# Patient Record
Sex: Female | Born: 1945 | Race: White | Hispanic: No | Marital: Married | Smoking: Former smoker
Health system: Southern US, Community
[De-identification: ages and names within clinical notes are randomized; demographics above are authoritative.]

## PROBLEM LIST (undated history)

## (undated) DIAGNOSIS — F329 Major depressive disorder, single episode, unspecified: Secondary | ICD-10-CM

## (undated) DIAGNOSIS — C52 Malignant neoplasm of vagina: Secondary | ICD-10-CM

## (undated) DIAGNOSIS — F32A Depression, unspecified: Secondary | ICD-10-CM

## (undated) DIAGNOSIS — I4891 Unspecified atrial fibrillation: Secondary | ICD-10-CM

## (undated) DIAGNOSIS — M810 Age-related osteoporosis without current pathological fracture: Secondary | ICD-10-CM

## (undated) DIAGNOSIS — C50919 Malignant neoplasm of unspecified site of unspecified female breast: Secondary | ICD-10-CM

## (undated) DIAGNOSIS — I10 Essential (primary) hypertension: Secondary | ICD-10-CM

## (undated) DIAGNOSIS — J309 Allergic rhinitis, unspecified: Secondary | ICD-10-CM

## (undated) HISTORY — DX: Essential (primary) hypertension: I10

## (undated) HISTORY — PX: NASAL SEPTUM SURGERY: SHX37

## (undated) HISTORY — DX: Malignant neoplasm of unspecified site of unspecified female breast: C50.919

## (undated) HISTORY — DX: Malignant neoplasm of vagina: C52

## (undated) HISTORY — DX: Unspecified atrial fibrillation: I48.91

## (undated) HISTORY — DX: Major depressive disorder, single episode, unspecified: F32.9

## (undated) HISTORY — DX: Depression, unspecified: F32.A

## (undated) HISTORY — DX: Age-related osteoporosis without current pathological fracture: M81.0

## (undated) HISTORY — PX: TUBAL LIGATION: SHX77

## (undated) HISTORY — DX: Allergic rhinitis, unspecified: J30.9

---

## 2007-07-22 ENCOUNTER — Ambulatory Visit: Payer: Self-pay | Admitting: Gastroenterology

## 2012-04-11 DIAGNOSIS — IMO0002 Reserved for concepts with insufficient information to code with codable children: Secondary | ICD-10-CM | POA: Insufficient documentation

## 2013-02-18 DIAGNOSIS — L739 Follicular disorder, unspecified: Secondary | ICD-10-CM | POA: Insufficient documentation

## 2013-02-18 DIAGNOSIS — D1801 Hemangioma of skin and subcutaneous tissue: Secondary | ICD-10-CM | POA: Insufficient documentation

## 2013-04-13 DIAGNOSIS — Z853 Personal history of malignant neoplasm of breast: Secondary | ICD-10-CM | POA: Insufficient documentation

## 2015-01-25 DIAGNOSIS — F341 Dysthymic disorder: Secondary | ICD-10-CM | POA: Diagnosis not present

## 2015-01-25 DIAGNOSIS — I10 Essential (primary) hypertension: Secondary | ICD-10-CM | POA: Diagnosis not present

## 2015-01-25 DIAGNOSIS — E341 Other hypersecretion of intestinal hormones: Secondary | ICD-10-CM | POA: Diagnosis not present

## 2015-01-25 DIAGNOSIS — Z803 Family history of malignant neoplasm of breast: Secondary | ICD-10-CM | POA: Diagnosis not present

## 2015-03-18 HISTORY — PX: BREAST SURGERY: SHX581

## 2015-03-22 DIAGNOSIS — N651 Disproportion of reconstructed breast: Secondary | ICD-10-CM | POA: Diagnosis not present

## 2015-03-22 DIAGNOSIS — M8580 Other specified disorders of bone density and structure, unspecified site: Secondary | ICD-10-CM | POA: Diagnosis not present

## 2015-03-22 DIAGNOSIS — K219 Gastro-esophageal reflux disease without esophagitis: Secondary | ICD-10-CM | POA: Diagnosis not present

## 2015-03-22 DIAGNOSIS — Z421 Encounter for breast reconstruction following mastectomy: Secondary | ICD-10-CM | POA: Diagnosis not present

## 2015-03-22 DIAGNOSIS — I10 Essential (primary) hypertension: Secondary | ICD-10-CM | POA: Diagnosis not present

## 2015-03-22 DIAGNOSIS — Z853 Personal history of malignant neoplasm of breast: Secondary | ICD-10-CM | POA: Diagnosis not present

## 2015-03-22 DIAGNOSIS — Z87891 Personal history of nicotine dependence: Secondary | ICD-10-CM | POA: Diagnosis not present

## 2015-03-22 DIAGNOSIS — F329 Major depressive disorder, single episode, unspecified: Secondary | ICD-10-CM | POA: Diagnosis not present

## 2015-03-22 DIAGNOSIS — Z9011 Acquired absence of right breast and nipple: Secondary | ICD-10-CM | POA: Diagnosis not present

## 2015-07-14 DIAGNOSIS — Z1239 Encounter for other screening for malignant neoplasm of breast: Secondary | ICD-10-CM

## 2015-07-14 DIAGNOSIS — M810 Age-related osteoporosis without current pathological fracture: Secondary | ICD-10-CM

## 2015-07-14 DIAGNOSIS — J309 Allergic rhinitis, unspecified: Secondary | ICD-10-CM

## 2015-07-14 DIAGNOSIS — C52 Malignant neoplasm of vagina: Secondary | ICD-10-CM

## 2015-07-14 DIAGNOSIS — I10 Essential (primary) hypertension: Secondary | ICD-10-CM

## 2015-07-14 DIAGNOSIS — C50919 Malignant neoplasm of unspecified site of unspecified female breast: Secondary | ICD-10-CM | POA: Insufficient documentation

## 2015-07-14 DIAGNOSIS — F32A Depression, unspecified: Secondary | ICD-10-CM | POA: Insufficient documentation

## 2015-07-14 DIAGNOSIS — M8588 Other specified disorders of bone density and structure, other site: Secondary | ICD-10-CM | POA: Insufficient documentation

## 2015-07-14 DIAGNOSIS — Z8544 Personal history of malignant neoplasm of other female genital organs: Secondary | ICD-10-CM | POA: Insufficient documentation

## 2015-07-14 DIAGNOSIS — F329 Major depressive disorder, single episode, unspecified: Secondary | ICD-10-CM | POA: Insufficient documentation

## 2015-07-18 ENCOUNTER — Ambulatory Visit (INDEPENDENT_AMBULATORY_CARE_PROVIDER_SITE_OTHER): Payer: Medicare Other | Admitting: Unknown Physician Specialty

## 2015-07-18 ENCOUNTER — Encounter: Payer: Self-pay | Admitting: Unknown Physician Specialty

## 2015-07-18 DIAGNOSIS — R079 Chest pain, unspecified: Secondary | ICD-10-CM

## 2015-07-18 DIAGNOSIS — I10 Essential (primary) hypertension: Secondary | ICD-10-CM | POA: Diagnosis not present

## 2015-07-18 DIAGNOSIS — F32A Depression, unspecified: Secondary | ICD-10-CM

## 2015-07-18 DIAGNOSIS — F329 Major depressive disorder, single episode, unspecified: Secondary | ICD-10-CM | POA: Diagnosis not present

## 2015-07-18 MED ORDER — CITALOPRAM HYDROBROMIDE 40 MG PO TABS: 40.0000 mg | ORAL_TABLET | Freq: Every day | ORAL | Status: DC

## 2015-07-18 MED ORDER — LOSARTAN POTASSIUM-HCTZ 50-12.5 MG PO TABS: 1.0000 | ORAL_TABLET | Freq: Every day | ORAL | Status: DC

## 2015-07-18 NOTE — Progress Notes (Signed)
BP 118/76 mmHg  Pulse 73  Temp(Src) 98.2 F (36.8 C)  Ht 5' 6.7" (1.694 m)  Wt 139 lb (63.05 kg)  BMI 21.97 kg/m2  SpO2 97%  LMP 01/03/1999 (Approximate)   Subjective:    Patient ID: Melanie Wallace, female    DOB: [birth year], 69 y.o.   MRN: [id number]  HPI: Melanie Wallace is a 69 y.o. female  Chief Complaint  Patient presents with  . Depression  . Hypertension   Depression Would like me to f/u with the Citalopram that she takes 40 mg QD through psychiatry.  Her depression is stable with a PHQ 9 of 0.  She would like to stay on it.    Hypertension This is a chronic (Here for refills) problem. The problem is controlled. Associated symptoms include chest pain. Pertinent negatives include no anxiety, palpitations, peripheral edema or shortness of breath. (One episode of chest pain off and on for 1 day about 6 weeks ago.  She has been fine since then.  Describes as a "pressure" pain) There are no associated agents to hypertension. The current treatment provides significant improvement. There are no compliance problems.      Relevant past medical, surgical, family and social history reviewed and updated as indicated. Interim medical history since our last visit reviewed. Allergies and medications reviewed and updated.  Review of Systems  Respiratory: Negative for shortness of breath.   Cardiovascular: Positive for chest pain. Negative for palpitations.    Per HPI unless specifically indicated above     Objective:    BP 118/76 mmHg  Pulse 73  Temp(Src) 98.2 F (36.8 C)  Ht 5' 6.7" (1.694 m)  Wt 139 lb (63.05 kg)  BMI 21.97 kg/m2  SpO2 97%  LMP 01/03/1999 (Approximate)  Wt Readings from Last 3 Encounters:  07/18/15 139 lb (63.05 kg)  01/25/15 143 lb (64.864 kg)    Physical Exam  Constitutional: She is oriented to person, place, and time. She appears well-developed and well-nourished. No distress.  HENT:  Head: Normocephalic and atraumatic.  Eyes: Conjunctivae  and lids are normal. Right eye exhibits no discharge. Left eye exhibits no discharge. No scleral icterus.  Cardiovascular: Normal rate and regular rhythm.   Pulmonary/Chest: Effort normal and breath sounds normal. No respiratory distress.  Abdominal: Soft. Normal appearance and bowel sounds are normal. She exhibits no distension. There is no splenomegaly or hepatomegaly. There is no tenderness.  Musculoskeletal: Normal range of motion.  Neurological: She is alert and oriented to person, place, and time.  Skin: Skin is intact. No rash noted. No pallor.  Psychiatric: She has a normal mood and affect. Her behavior is normal. Judgment and thought content normal.    No results found for this or any previous visit.    Assessment & Plan:   Problem List Items Addressed This Visit      Unprioritized   Hypertension   Relevant Medications   losartan-hydrochlorothiazide (HYZAAR) 50-12.5 MG per tablet   Other Relevant Orders   EKG 12-Lead (Completed)   Depression    Stable on 40 mg Citalopram.  Continue current medications.        Relevant Medications   citalopram (CELEXA) 40 MG tablet    Other Visit Diagnoses    Chest pain, unspecified chest pain type        EKG was normal.  No episodes since once 6 weeks ago.  F/U prn    Relevant Orders    EKG 12-Lead (Completed)  EKG was normal   Follow up plan: Return in about 6 months (around 01/18/2016) for physical.

## 2015-07-18 NOTE — Assessment & Plan Note (Signed)
Stable on 40 mg Citalopram.  Continue current medications.

## 2015-09-06 DIAGNOSIS — N651 Disproportion of reconstructed breast: Secondary | ICD-10-CM | POA: Diagnosis not present

## 2015-09-06 DIAGNOSIS — K219 Gastro-esophageal reflux disease without esophagitis: Secondary | ICD-10-CM | POA: Diagnosis not present

## 2015-09-06 DIAGNOSIS — Z853 Personal history of malignant neoplasm of breast: Secondary | ICD-10-CM | POA: Diagnosis not present

## 2015-09-06 DIAGNOSIS — Z9011 Acquired absence of right breast and nipple: Secondary | ICD-10-CM | POA: Diagnosis not present

## 2015-09-06 DIAGNOSIS — N650 Deformity of reconstructed breast: Secondary | ICD-10-CM | POA: Diagnosis not present

## 2015-09-06 DIAGNOSIS — I10 Essential (primary) hypertension: Secondary | ICD-10-CM | POA: Diagnosis not present

## 2015-09-06 DIAGNOSIS — C50911 Malignant neoplasm of unspecified site of right female breast: Secondary | ICD-10-CM | POA: Diagnosis not present

## 2015-10-13 DIAGNOSIS — I10 Essential (primary) hypertension: Secondary | ICD-10-CM | POA: Diagnosis not present

## 2015-10-13 DIAGNOSIS — Z08 Encounter for follow-up examination after completed treatment for malignant neoplasm: Secondary | ICD-10-CM | POA: Diagnosis not present

## 2015-10-13 DIAGNOSIS — Z853 Personal history of malignant neoplasm of breast: Secondary | ICD-10-CM | POA: Diagnosis not present

## 2015-10-13 DIAGNOSIS — C50911 Malignant neoplasm of unspecified site of right female breast: Secondary | ICD-10-CM | POA: Diagnosis not present

## 2015-10-13 DIAGNOSIS — Z7982 Long term (current) use of aspirin: Secondary | ICD-10-CM | POA: Diagnosis not present

## 2015-10-13 DIAGNOSIS — Z79899 Other long term (current) drug therapy: Secondary | ICD-10-CM | POA: Diagnosis not present

## 2015-10-13 DIAGNOSIS — Z9882 Breast implant status: Secondary | ICD-10-CM | POA: Diagnosis not present

## 2015-10-13 DIAGNOSIS — F329 Major depressive disorder, single episode, unspecified: Secondary | ICD-10-CM | POA: Diagnosis not present

## 2015-10-13 DIAGNOSIS — Z9011 Acquired absence of right breast and nipple: Secondary | ICD-10-CM | POA: Diagnosis not present

## 2015-10-13 DIAGNOSIS — R921 Mammographic calcification found on diagnostic imaging of breast: Secondary | ICD-10-CM | POA: Diagnosis not present

## 2015-11-08 LAB — HM MAMMOGRAPHY: HM Mammogram: NORMAL (ref 0–4)

## 2016-01-03 DIAGNOSIS — Z9011 Acquired absence of right breast and nipple: Secondary | ICD-10-CM | POA: Diagnosis not present

## 2016-01-27 ENCOUNTER — Encounter: Payer: Medicare Other | Admitting: Unknown Physician Specialty

## 2016-01-31 ENCOUNTER — Ambulatory Visit (INDEPENDENT_AMBULATORY_CARE_PROVIDER_SITE_OTHER): Payer: Medicare Other | Admitting: Unknown Physician Specialty

## 2016-01-31 ENCOUNTER — Encounter: Payer: Self-pay | Admitting: Unknown Physician Specialty

## 2016-01-31 VITALS — BP 139/92 | HR 40 | Temp 97.7°F | Ht 66.2 in | Wt 148.4 lb

## 2016-01-31 DIAGNOSIS — I10 Essential (primary) hypertension: Secondary | ICD-10-CM

## 2016-01-31 DIAGNOSIS — F329 Major depressive disorder, single episode, unspecified: Secondary | ICD-10-CM

## 2016-01-31 DIAGNOSIS — Z0000 Encounter for general adult medical examination without abnormal findings: Secondary | ICD-10-CM | POA: Diagnosis not present

## 2016-01-31 DIAGNOSIS — F32A Depression, unspecified: Secondary | ICD-10-CM

## 2016-01-31 LAB — MICROALBUMIN, URINE WAIVED
Creatinine, Urine Waived: 50 mg/dL (ref 10–300)
Microalb, Ur Waived: 10 mg/L (ref 0–19)

## 2016-01-31 MED ORDER — LOSARTAN POTASSIUM-HCTZ 50-12.5 MG PO TABS: 1.0000 | ORAL_TABLET | Freq: Every day | ORAL | Status: DC

## 2016-01-31 MED ORDER — CITALOPRAM HYDROBROMIDE 40 MG PO TABS: 40.0000 mg | ORAL_TABLET | Freq: Every day | ORAL | Status: DC

## 2016-01-31 NOTE — Progress Notes (Signed)
BP 139/92 mmHg  Pulse 40  Temp(Src) 97.7 F (36.5 C)  Ht 5' 6.2" (1.681 m)  Wt 148 lb 6.4 oz (67.314 kg)  BMI 23.82 kg/m2  SpO2 99%  LMP 01/03/1999 (Approximate)   Subjective:    Patient ID: Melanie Wallace, female    DOB: 15-Nov-1946, 70 y.o.   MRN: [id number]  HPI: Melanie Wallace is a 70 y.o. female  Chief Complaint  Patient presents with  . Medicare Wellness   Functional Status Survey: Is the patient deaf or have difficulty hearing?: No Does the patient have difficulty seeing, even when wearing glasses/contacts?: No Does the patient have difficulty concentrating, remembering, or making decisions?: No Does the patient have difficulty walking or climbing stairs?: No Does the patient have difficulty dressing or bathing?: No Does the patient have difficulty doing errands alone such as visiting a doctor's office or shopping?: No  Depression screen [facility] 2/9 01/31/2016 07/18/2015  Decreased Interest 0 0  Down, Depressed, Hopeless 0 0  PHQ - 2 Score 0 0   Fall Risk  01/31/2016 07/18/2015  Falls in the past year? No No    Had guiac stool cards done.  Interested in cologuard.    Breast checks done by breast surgeon  Hypertension Using medications without difficulty Average home BPs: 120's/70s   No problems or lightheadedness No chest pain with exertion or shortness of breath No Edema Exercise: none  Relevant past medical, surgical, family and social history reviewed and updated as indicated. Interim medical history since our last visit reviewed. Allergies and medications reviewed and updated.  Review of Systems  Constitutional: Negative.   HENT: Negative.   Eyes: Negative.   Respiratory: Negative.   Cardiovascular: Negative.   Gastrointestinal: Negative.   Endocrine: Negative.   Genitourinary: Negative.   Musculoskeletal: Negative.   Skin: Negative.   Allergic/Immunologic: Negative.   Neurological: Negative.   Hematological: Negative.   Psychiatric/Behavioral:  Negative.     Per HPI unless specifically indicated above     Objective:    BP 139/92 mmHg  Pulse 40  Temp(Src) 97.7 F (36.5 C)  Ht 5' 6.2" (1.681 m)  Wt 148 lb 6.4 oz (67.314 kg)  BMI 23.82 kg/m2  SpO2 99%  LMP 01/03/1999 (Approximate)  Wt Readings from Last 3 Encounters:  01/31/16 148 lb 6.4 oz (67.314 kg)  07/18/15 139 lb (63.05 kg)  01/25/15 143 lb (64.864 kg)    Physical Exam  Constitutional: She is oriented to person, place, and time. She appears well-developed and well-nourished.  HENT:  Head: Normocephalic and atraumatic.  Eyes: Pupils are equal, round, and reactive to light. Right eye exhibits no discharge. Left eye exhibits no discharge. No scleral icterus.  Neck: Normal range of motion. Neck supple. Carotid bruit is not present. No thyromegaly present.  Cardiovascular: Normal rate, regular rhythm and normal heart sounds.  Exam reveals no gallop and no friction rub.   No murmur heard. Pulmonary/Chest: Effort normal and breath sounds normal. No respiratory distress. She has no wheezes. She has no rales.  Abdominal: Soft. Bowel sounds are normal. There is no tenderness. There is no rebound.  Musculoskeletal: Normal range of motion.  Lymphadenopathy:    She has no cervical adenopathy.  Neurological: She is alert and oriented to person, place, and time.  Skin: Skin is warm, dry and intact. No rash noted.  Psychiatric: She has a normal mood and affect. Her speech is normal and behavior is normal. Judgment and thought content normal. Cognition and  memory are normal.    No results found for this or any previous visit.    Assessment & Plan:   Problem List Items Addressed This Visit      Unprioritized   Hypertension - Primary   Relevant Medications   losartan-hydrochlorothiazide (HYZAAR) 50-12.5 MG tablet   Other Relevant Orders   Microalbumin, Urine Waived   Comprehensive metabolic panel   Lipid Panel w/o Chol/HDL Ratio   Depression   Relevant Medications    citalopram (CELEXA) 40 MG tablet    Other Visit Diagnoses    Annual physical exam        Relevant Orders    Hepatitis C antibody      Diagnosis stable.  Continue present medications.     Follow up plan: Return in about 6 months (around 07/30/2016).

## 2016-02-01 ENCOUNTER — Encounter: Payer: Self-pay | Admitting: Unknown Physician Specialty

## 2016-02-01 LAB — COMPREHENSIVE METABOLIC PANEL
ALBUMIN: 4.6 g/dL (ref 3.6–4.8)
ALT: 10 IU/L (ref 0–32)
AST: 17 IU/L (ref 0–40)
Albumin/Globulin Ratio: 2.2 (ref 1.1–2.5)
Alkaline Phosphatase: 51 IU/L (ref 39–117)
BILIRUBIN TOTAL: 0.6 mg/dL (ref 0.0–1.2)
BUN / CREAT RATIO: 21 (ref 11–26)
BUN: 14 mg/dL (ref 8–27)
CO2: 26 mmol/L (ref 18–29)
CREATININE: 0.66 mg/dL (ref 0.57–1.00)
Calcium: 9.6 mg/dL (ref 8.7–10.3)
Chloride: 99 mmol/L (ref 96–106)
GFR calc non Af Amer: 91 mL/min/{1.73_m2} (ref >59)
GFR, EST AFRICAN AMERICAN: 105 mL/min/{1.73_m2} (ref >59)
GLOBULIN, TOTAL: 2.1 g/dL (ref 1.5–4.5)
GLUCOSE: 94 mg/dL (ref 65–99)
Potassium: 4.2 mmol/L (ref 3.5–5.2)
SODIUM: 138 mmol/L (ref 134–144)
TOTAL PROTEIN: 6.7 g/dL (ref 6.0–8.5)

## 2016-02-01 LAB — LIPID PANEL W/O CHOL/HDL RATIO
Cholesterol, Total: 196 mg/dL (ref 100–199)
HDL: 58 mg/dL (ref >39)
LDL CALC: 109 mg/dL — AB (ref 0–99)
Triglycerides: 143 mg/dL (ref 0–149)
VLDL CHOLESTEROL CAL: 29 mg/dL (ref 5–40)

## 2016-02-01 LAB — HEPATITIS C ANTIBODY: Hep C Virus Ab: <0.1 s/co ratio (ref 0.0–0.9)

## 2016-02-01 NOTE — Progress Notes (Signed)
Quick Note:  Normal labs. Patient notified by letter. ______ 

## 2016-07-31 ENCOUNTER — Ambulatory Visit (INDEPENDENT_AMBULATORY_CARE_PROVIDER_SITE_OTHER): Payer: Medicare Other | Admitting: Unknown Physician Specialty

## 2016-07-31 ENCOUNTER — Encounter: Payer: Self-pay | Admitting: Unknown Physician Specialty

## 2016-07-31 VITALS — BP 125/80 | HR 82 | Temp 97.9°F | Ht 67.2 in | Wt 146.4 lb

## 2016-07-31 DIAGNOSIS — I10 Essential (primary) hypertension: Secondary | ICD-10-CM | POA: Diagnosis not present

## 2016-07-31 DIAGNOSIS — Z23 Encounter for immunization: Secondary | ICD-10-CM | POA: Diagnosis not present

## 2016-07-31 DIAGNOSIS — F329 Major depressive disorder, single episode, unspecified: Secondary | ICD-10-CM

## 2016-07-31 DIAGNOSIS — F32A Depression, unspecified: Secondary | ICD-10-CM

## 2016-07-31 DIAGNOSIS — Z0000 Encounter for general adult medical examination without abnormal findings: Secondary | ICD-10-CM | POA: Diagnosis not present

## 2016-07-31 NOTE — Assessment & Plan Note (Signed)
Stable, continue present medications.   

## 2016-07-31 NOTE — Progress Notes (Signed)
BP 125/80 (BP Location: Left Arm, Patient Position: Sitting, Cuff Size: Normal)   Pulse 82   Temp 97.9 F (36.6 C)   Ht 5' 7.2" (1.707 m)   Wt 146 lb 6.4 oz (66.4 kg)   LMP 01/03/1999 (Approximate)   SpO2 98%   BMI 22.79 kg/m    Subjective:    Patient ID: Melanie Wallace, female    DOB: [birth year], 70 y.o.   MRN: [id number]  HPI: Melanie Wallace is a 70 y.o. female  Chief Complaint  Patient presents with  . Depression  . Hypertension  . Ankle Pain    pt states she has a dull ache in her right ankle when she walks sometimes 0   Depression "I feel good" Depression screen [facility] 2/9 07/31/2016 01/31/2016 07/18/2015  Decreased Interest 0 0 0  Down, Depressed, Hopeless 0 0 0  PHQ - 2 Score 0 0 0   Hypertension Using medications without difficulty Average home BPs Not checking  No problems or lightheadedness No chest pain with exertion or shortness of breath No Edema  Ankle pain Pt with medial ankle and lower leg  Relevant past medical, surgical, family and social history reviewed and updated as indicated. Interim medical history since our last visit reviewed. Allergies and medications reviewed and updated.  Review of Systems  Per HPI unless specifically indicated above     Objective:    BP 125/80 (BP Location: Left Arm, Patient Position: Sitting, Cuff Size: Normal)   Pulse 82   Temp 97.9 F (36.6 C)   Ht 5' 7.2" (1.707 m)   Wt 146 lb 6.4 oz (66.4 kg)   LMP 01/03/1999 (Approximate)   SpO2 98%   BMI 22.79 kg/m   Wt Readings from Last 3 Encounters:  07/31/16 146 lb 6.4 oz (66.4 kg)  01/31/16 148 lb 6.4 oz (67.3 kg)  07/18/15 139 lb (63 kg)    Physical Exam  Constitutional: She is oriented to person, place, and time. She appears well-developed and well-nourished. No distress.  HENT:  Head: Normocephalic and atraumatic.  Eyes: Conjunctivae and lids are normal. Right eye exhibits no discharge. Left eye exhibits no discharge. No scleral icterus.  Neck: Normal  range of motion. Neck supple. No JVD present. Carotid bruit is not present.  Cardiovascular: Normal rate, regular rhythm and normal heart sounds.   Pulmonary/Chest: Effort normal and breath sounds normal.  Abdominal: Normal appearance. There is no splenomegaly or hepatomegaly.  Musculoskeletal: Normal range of motion.  Neurological: She is alert and oriented to person, place, and time.  Skin: Skin is warm, dry and intact. No rash noted. No pallor.  Psychiatric: She has a normal mood and affect. Her behavior is normal. Judgment and thought content normal.    Results for orders placed or performed in visit on 07/31/16  HM MAMMOGRAPHY  Result Value Ref Range   HM Mammogram Self Reported Normal 0-4 Bi-Rad, Self Reported Normal      Assessment & Plan:   Problem List Items Addressed This Visit      Unprioritized   Depression    Stable, continue present medications.        Hypertension    Stable, continue present medications.         Other Visit Diagnoses    Need for diphtheria-tetanus-pertussis (Tdap) vaccine, adult/adolescent    -  Primary   Relevant Orders   Tdap vaccine greater than or equal to 7yo IM (Completed)   Routine general medical examination at a  health care facility       Relevant Orders   Ambulatory referral to Gastroenterology       Follow up plan: Return in about 6 months (around 01/31/2017) for physical.

## 2016-07-31 NOTE — Patient Instructions (Addendum)
Tdap Vaccine (Tetanus, Diphtheria and Pertussis): What You Need to Know 1. Why get vaccinated? Tetanus, diphtheria and pertussis are very serious diseases. Tdap vaccine can protect us from these diseases. And, Tdap vaccine given to pregnant women can protect newborn babies against pertussis. TETANUS (Lockjaw) is rare in the [place] today. It causes painful muscle tightening and stiffness, usually all over the body.  It can lead to tightening of muscles in the head and neck so you can't open your mouth, swallow, or sometimes even breathe. Tetanus kills about 1 out of 10 people who are infected even after receiving the best medical care. DIPHTHERIA is also rare in the [place] today. It can cause a thick coating to form in the back of the throat.  It can lead to breathing problems, heart failure, paralysis, and death. PERTUSSIS (Whooping Cough) causes severe coughing spells, which can cause difficulty breathing, vomiting and disturbed sleep.  It can also lead to weight loss, incontinence, and rib fractures. Up to 2 in 100 adolescents and 5 in 100 adults with pertussis are hospitalized or have complications, which could include pneumonia or death. These diseases are caused by bacteria. Diphtheria and pertussis are spread from person to person through secretions from coughing or sneezing. Tetanus enters the body through cuts, scratches, or wounds. Before vaccines, as many as 200,000 cases of diphtheria, 200,000 cases of pertussis, and hundreds of cases of tetanus, were reported in the [place] each year. Since vaccination began, reports of cases for tetanus and diphtheria have dropped by about 99% and for pertussis by about 80%. 2. Tdap vaccine Tdap vaccine can protect adolescents and adults from tetanus, diphtheria, and pertussis. One dose of Tdap is routinely given at age 11 or 12. People who did not get Tdap at that age should get it as soon as possible. Tdap is especially important  for healthcare professionals and anyone having close contact with a baby younger than 12 months. Pregnant women should get a dose of Tdap during every pregnancy, to protect the newborn from pertussis. Infants are most at risk for severe, life-threatening complications from pertussis. Another vaccine, called Td, protects against tetanus and diphtheria, but not pertussis. A Td booster should be given every 10 years. Tdap may be given as one of these boosters if you have never gotten Tdap before. Tdap may also be given after a severe cut or burn to prevent tetanus infection. Your doctor or the person giving you the vaccine can give you more information. Tdap may safely be given at the same time as other vaccines. 3. Some people should not get this vaccine  A person who has ever had a life-threatening allergic reaction after a previous dose of any diphtheria, tetanus or pertussis containing vaccine, OR has a severe allergy to any part of this vaccine, should not get Tdap vaccine. Tell the person giving the vaccine about any severe allergies.  Anyone who had coma or long repeated seizures within 7 days after a childhood dose of DTP or DTaP, or a previous dose of Tdap, should not get Tdap, unless a cause other than the vaccine was found. They can still get Td.  Talk to your doctor if you:  have seizures or another nervous system problem,  had severe pain or swelling after any vaccine containing diphtheria, tetanus or pertussis,  ever had a condition called Guillain-Barr Syndrome (GBS),  aren't feeling well on the day the shot is scheduled. 4. Risks With any medicine, including vaccines, there is   a chance of side effects. These are usually mild and go away on their own. Serious reactions are also possible but are rare. Most people who get Tdap vaccine do not have any problems with it. Mild problems following Tdap (Did not interfere with activities)  Pain where the shot was given (about 3 in 4  adolescents or 2 in 3 adults)  Redness or swelling where the shot was given (about 1 person in 5)  Mild fever of at least 100.4F (up to about 1 in 25 adolescents or 1 in 100 adults)  Headache (about 3 or 4 people in 10)  Tiredness (about 1 person in 3 or 4)  Nausea, vomiting, diarrhea, stomach ache (up to 1 in 4 adolescents or 1 in 10 adults)  Chills, sore joints (about 1 person in 10)  Body aches (about 1 person in 3 or 4)  Rash, swollen glands (uncommon) Moderate problems following Tdap (Interfered with activities, but did not require medical attention)  Pain where the shot was given (up to 1 in 5 or 6)  Redness or swelling where the shot was given (up to about 1 in 16 adolescents or 1 in 12 adults)  Fever over 102F (about 1 in 100 adolescents or 1 in 250 adults)  Headache (about 1 in 7 adolescents or 1 in 10 adults)  Nausea, vomiting, diarrhea, stomach ache (up to 1 or 3 people in 100)  Swelling of the entire arm where the shot was given (up to about 1 in 500). Severe problems following Tdap (Unable to perform usual activities; required medical attention)  Swelling, severe pain, bleeding and redness in the arm where the shot was given (rare). Problems that could happen after any vaccine:  People sometimes faint after a medical procedure, including vaccination. Sitting or lying down for about 15 minutes can help prevent fainting, and injuries caused by a fall. Tell your doctor if you feel dizzy, or have vision changes or ringing in the ears.  Some people get severe pain in the shoulder and have difficulty moving the arm where a shot was given. This happens very rarely.  Any medication can cause a severe allergic reaction. Such reactions from a vaccine are very rare, estimated at fewer than 1 in a million doses, and would happen within a few minutes to a few hours after the vaccination. As with any medicine, there is a very remote chance of a vaccine causing a serious  injury or death. The safety of vaccines is always being monitored. For more information, visit: www.cdc.gov/vaccinesafety/ 5. What if there is a serious problem? What should I look for?  Look for anything that concerns you, such as signs of a severe allergic reaction, very high fever, or unusual behavior.  Signs of a severe allergic reaction can include hives, swelling of the face and throat, difficulty breathing, a fast heartbeat, dizziness, and weakness. These would usually start a few minutes to a few hours after the vaccination. What should I do?  If you think it is a severe allergic reaction or other emergency that can't wait, call 9-1-1 or get the person to the nearest hospital. Otherwise, call your doctor.  Afterward, the reaction should be reported to the Vaccine Adverse Event Reporting System (VAERS). Your doctor might file this report, or you can do it yourself through the VAERS web site at www.vaers.hhs.gov, or by calling 1-800-822-7967. VAERS does not give medical advice.  6. The National Vaccine Injury Compensation Program The National Vaccine Injury Compensation Program (  VICP) is a federal program that was created to compensate people who may have been injured by certain vaccines. Persons who believe they may have been injured by a vaccine can learn about the program and about filing a claim by calling 1-800-338-2382 or visiting the VICP website at www.hrsa.gov/vaccinecompensation. There is a time limit to file a claim for compensation. 7. How can I learn more?  Ask your doctor. He or she can give you the vaccine package insert or suggest other sources of information.  Call your local or state health department.  Contact the Centers for Disease Control and Prevention (CDC):  Call 1-800-232-4636 (1-800-CDC-INFO) or  Visit CDC's website at www.cdc.gov/vaccines CDC Tdap Vaccine VIS (02/09/14)   This information is not intended to replace advice given to you by your health care  provider. Make sure you discuss any questions you have with your health care provider.   Document Released: 06/03/2012 Document Revised: 12/24/2014 Document Reviewed: 03/17/2014 Elsevier Interactive Patient Education 2016 [employer]  

## 2016-09-24 DIAGNOSIS — I480 Paroxysmal atrial fibrillation: Secondary | ICD-10-CM | POA: Diagnosis not present

## 2016-09-27 DIAGNOSIS — Z170 Estrogen receptor positive status [ER+]: Secondary | ICD-10-CM | POA: Diagnosis not present

## 2016-09-27 DIAGNOSIS — R142 Eructation: Secondary | ICD-10-CM | POA: Diagnosis not present

## 2016-09-27 DIAGNOSIS — C50911 Malignant neoplasm of unspecified site of right female breast: Secondary | ICD-10-CM | POA: Diagnosis not present

## 2016-09-27 DIAGNOSIS — R921 Mammographic calcification found on diagnostic imaging of breast: Secondary | ICD-10-CM | POA: Diagnosis not present

## 2016-09-27 DIAGNOSIS — N952 Postmenopausal atrophic vaginitis: Secondary | ICD-10-CM | POA: Diagnosis not present

## 2016-09-27 DIAGNOSIS — Z9229 Personal history of other drug therapy: Secondary | ICD-10-CM | POA: Diagnosis not present

## 2016-09-27 DIAGNOSIS — I480 Paroxysmal atrial fibrillation: Secondary | ICD-10-CM | POA: Diagnosis not present

## 2016-09-27 DIAGNOSIS — K219 Gastro-esophageal reflux disease without esophagitis: Secondary | ICD-10-CM | POA: Diagnosis not present

## 2016-09-27 DIAGNOSIS — D1801 Hemangioma of skin and subcutaneous tissue: Secondary | ICD-10-CM | POA: Diagnosis not present

## 2016-09-27 DIAGNOSIS — Z7901 Long term (current) use of anticoagulants: Secondary | ICD-10-CM | POA: Diagnosis not present

## 2016-09-27 DIAGNOSIS — I10 Essential (primary) hypertension: Secondary | ICD-10-CM | POA: Diagnosis not present

## 2016-09-27 DIAGNOSIS — Z853 Personal history of malignant neoplasm of breast: Secondary | ICD-10-CM | POA: Diagnosis not present

## 2016-09-27 DIAGNOSIS — Z87891 Personal history of nicotine dependence: Secondary | ICD-10-CM | POA: Diagnosis not present

## 2016-09-27 DIAGNOSIS — Z79899 Other long term (current) drug therapy: Secondary | ICD-10-CM | POA: Diagnosis not present

## 2016-09-27 DIAGNOSIS — Z08 Encounter for follow-up examination after completed treatment for malignant neoplasm: Secondary | ICD-10-CM | POA: Diagnosis not present

## 2016-09-27 DIAGNOSIS — Z7982 Long term (current) use of aspirin: Secondary | ICD-10-CM | POA: Diagnosis not present

## 2016-09-27 DIAGNOSIS — R922 Inconclusive mammogram: Secondary | ICD-10-CM | POA: Diagnosis not present

## 2016-09-27 DIAGNOSIS — Z9011 Acquired absence of right breast and nipple: Secondary | ICD-10-CM | POA: Diagnosis not present

## 2016-10-10 DIAGNOSIS — I361 Nonrheumatic tricuspid (valve) insufficiency: Secondary | ICD-10-CM | POA: Diagnosis not present

## 2016-10-10 DIAGNOSIS — I340 Nonrheumatic mitral (valve) insufficiency: Secondary | ICD-10-CM | POA: Diagnosis not present

## 2016-10-10 DIAGNOSIS — Z7901 Long term (current) use of anticoagulants: Secondary | ICD-10-CM | POA: Diagnosis not present

## 2016-10-10 DIAGNOSIS — Z23 Encounter for immunization: Secondary | ICD-10-CM | POA: Diagnosis not present

## 2016-10-10 DIAGNOSIS — K219 Gastro-esophageal reflux disease without esophagitis: Secondary | ICD-10-CM | POA: Diagnosis not present

## 2016-10-10 DIAGNOSIS — I480 Paroxysmal atrial fibrillation: Secondary | ICD-10-CM | POA: Diagnosis not present

## 2016-10-10 DIAGNOSIS — I10 Essential (primary) hypertension: Secondary | ICD-10-CM | POA: Diagnosis not present

## 2016-10-10 DIAGNOSIS — Z87891 Personal history of nicotine dependence: Secondary | ICD-10-CM | POA: Diagnosis not present

## 2016-10-10 DIAGNOSIS — I700 Atherosclerosis of aorta: Secondary | ICD-10-CM | POA: Diagnosis not present

## 2016-10-19 DIAGNOSIS — I2510 Atherosclerotic heart disease of native coronary artery without angina pectoris: Secondary | ICD-10-CM | POA: Diagnosis not present

## 2016-10-19 DIAGNOSIS — I10 Essential (primary) hypertension: Secondary | ICD-10-CM | POA: Diagnosis not present

## 2016-10-19 DIAGNOSIS — C50919 Malignant neoplasm of unspecified site of unspecified female breast: Secondary | ICD-10-CM | POA: Diagnosis not present

## 2016-10-19 DIAGNOSIS — I480 Paroxysmal atrial fibrillation: Secondary | ICD-10-CM | POA: Diagnosis not present

## 2017-01-14 DIAGNOSIS — I480 Paroxysmal atrial fibrillation: Secondary | ICD-10-CM | POA: Diagnosis not present

## 2017-01-14 DIAGNOSIS — I10 Essential (primary) hypertension: Secondary | ICD-10-CM | POA: Diagnosis not present

## 2017-02-04 ENCOUNTER — Encounter: Payer: Self-pay | Admitting: Unknown Physician Specialty

## 2017-02-04 ENCOUNTER — Ambulatory Visit (INDEPENDENT_AMBULATORY_CARE_PROVIDER_SITE_OTHER): Payer: Medicare Other | Admitting: Unknown Physician Specialty

## 2017-02-04 VITALS — BP 111/73 | HR 71 | Temp 97.8°F | Ht 67.0 in | Wt 154.1 lb

## 2017-02-04 DIAGNOSIS — F325 Major depressive disorder, single episode, in full remission: Secondary | ICD-10-CM | POA: Diagnosis not present

## 2017-02-04 DIAGNOSIS — I4891 Unspecified atrial fibrillation: Secondary | ICD-10-CM | POA: Diagnosis not present

## 2017-02-04 DIAGNOSIS — Z0000 Encounter for general adult medical examination without abnormal findings: Secondary | ICD-10-CM | POA: Diagnosis not present

## 2017-02-04 DIAGNOSIS — I10 Essential (primary) hypertension: Secondary | ICD-10-CM | POA: Diagnosis not present

## 2017-02-04 MED ORDER — CITALOPRAM HYDROBROMIDE 40 MG PO TABS: 40.0000 mg | ORAL_TABLET | Freq: Every day | ORAL | 3 refills | Status: DC

## 2017-02-04 MED ORDER — LOSARTAN POTASSIUM-HCTZ 50-12.5 MG PO TABS: 1.0000 | ORAL_TABLET | Freq: Every day | ORAL | 3 refills | Status: DC

## 2017-02-04 NOTE — Assessment & Plan Note (Signed)
Stable, continue present medications.   

## 2017-02-04 NOTE — Assessment & Plan Note (Addendum)
Continue Eliquis.  F/U with Dr. Sabra Heck

## 2017-02-04 NOTE — Progress Notes (Signed)
BP 111/73 (BP Location: Left Arm, Patient Position: Sitting, Cuff Size: Normal)   Pulse 71   Temp 97.8 F (36.6 C)   Ht 5\' 7"  (1.702 m) Comment: pt refused to remove shoes  Wt 154 lb 1.6 oz (69.9 kg) Comment: pt refused to remove shoes  LMP 01/03/1999 (Approximate)   SpO2 98%   BMI 24.14 kg/m    Subjective:    Patient ID: Melanie Wallace, female    DOB: 14-Aug-1946, 71 y.o.   MRN: [id number]  HPI: Melanie Wallace is a 71 y.o. female  Chief Complaint  Patient presents with  . Medicare Wellness   Social History   Social History  . Marital status: Married    Spouse name: N/A  . Number of children: N/A  . Years of education: N/A   Occupational History  . Not on file.   Social History Main Topics  . Smoking status: Former Research scientist (life sciences)  . Smokeless tobacco: Never Used  . Alcohol use No  . Drug use: No  . Sexual activity: Yes   Other Topics Concern  . Not on file   Social History Narrative  . No narrative on file   Family History  Problem Relation Age of Onset  . Cancer Mother     breast and bone  . Hypertension Mother   . Cancer Father     prostate  . Hypertension Sister   . Hypertension Daughter   . Hypothyroidism Daughter   . Allergies Daughter   . Hyperlipidemia Maternal Grandmother   . Heart attack Maternal Grandmother   . Stroke Paternal Grandfather    Past Medical History:  Diagnosis Date  . Allergic rhinitis   . Atrial fibrillation ([place])   . Breast CA ([place])   . Depression   . Hypertension   . Osteoporosis   . Vaginal cancer [facility])    Past Surgical History:  Procedure Laterality Date  . BREAST SURGERY  03/2015  . NASAL SEPTUM SURGERY    . TUBAL LIGATION     New onset A-fib Went for colonoscopy and found she was in A-fib.  She saw Dr. Sabra Heck that day and put on anti-coagulation, nuclear stress test, and labs.    Hypertension Using medications without difficulty   No problems or lightheadedness No chest pain with exertion or shortness of  breath No Edema  Functional Status Survey: Is the patient deaf or have difficulty hearing?: No Does the patient have difficulty seeing, even when wearing glasses/contacts?: No Does the patient have difficulty concentrating, remembering, or making decisions?: No Does the patient have difficulty walking or climbing stairs?: No Does the patient have difficulty dressing or bathing?: No Does the patient have difficulty doing errands alone such as visiting a doctor's office or shopping?: No  Fall Risk  02/04/2017 07/31/2016 01/31/2016 07/18/2015  Falls in the past year? No No No No   Depression screen [facility] 2/9 02/04/2017 07/31/2016 01/31/2016 07/18/2015  Decreased Interest 0 0 0 0  Down, Depressed, Hopeless 0 0 0 0  PHQ - 2 Score 0 0 0 0  Altered sleeping 0 - - -  Tired, decreased energy 0 - - -  Change in appetite 0 - - -  Feeling bad or failure about yourself  0 - - -  Trouble concentrating 0 - - -  Moving slowly or fidgety/restless 0 - - -  Suicidal thoughts 0 - - -  PHQ-9 Score 0 - - -    Relevant past medical, surgical, family  and social history reviewed and updated as indicated. Interim medical history since our last visit reviewed. Allergies and medications reviewed and updated.  Review of Systems  Per HPI unless specifically indicated above     Objective:    BP 111/73 (BP Location: Left Arm, Patient Position: Sitting, Cuff Size: Normal)   Pulse 71   Temp 97.8 F (36.6 C)   Ht 5\' 7"  (1.702 m) Comment: pt refused to remove shoes  Wt 154 lb 1.6 oz (69.9 kg) Comment: pt refused to remove shoes  LMP 01/03/1999 (Approximate)   SpO2 98%   BMI 24.14 kg/m   Wt Readings from Last 3 Encounters:  02/04/17 154 lb 1.6 oz (69.9 kg)  07/31/16 146 lb 6.4 oz (66.4 kg)  01/31/16 148 lb 6.4 oz (67.3 kg)    Physical Exam  Constitutional: She is oriented to person, place, and time. She appears well-developed and well-nourished.  HENT:  Head: Normocephalic and atraumatic.  Eyes: Pupils are  equal, round, and reactive to light. Right eye exhibits no discharge. Left eye exhibits no discharge. No scleral icterus.  Neck: Normal range of motion. Neck supple. Carotid bruit is not present. No thyromegaly present.  Cardiovascular: Normal rate, regular rhythm and normal heart sounds.  Exam reveals no gallop and no friction rub.   No murmur heard. Pulmonary/Chest: Effort normal and breath sounds normal. No respiratory distress. She has no wheezes. She has no rales.  Abdominal: Soft. Bowel sounds are normal. There is no tenderness. There is no rebound.  Genitourinary: No breast swelling, tenderness or discharge.  Musculoskeletal: Normal range of motion.  Lymphadenopathy:    She has no cervical adenopathy.  Neurological: She is alert and oriented to person, place, and time.  Skin: Skin is warm, dry and intact. No rash noted.  Psychiatric: She has a normal mood and affect. Her speech is normal and behavior is normal. Judgment and thought content normal. Cognition and memory are normal.   Labs done with cardiology  Results for orders placed or performed in visit on 07/31/16  HM MAMMOGRAPHY  Result Value Ref Range   HM Mammogram Self Reported Normal 0-4 Bi-Rad, Self Reported Normal      Assessment & Plan:   Problem List Items Addressed This Visit      Unprioritized   Atrial fibrillation ([place])    Continue Eliquis.  F/U with Dr. Sabra Heck      Relevant Medications   metoprolol succinate (TOPROL-XL) 25 MG 24 hr tablet   apixaban (ELIQUIS) 5 MG TABS tablet   Depression    Stable, continue present medications.        Hypertension    Stable, continue present medications.        Relevant Medications   metoprolol succinate (TOPROL-XL) 25 MG 24 hr tablet   apixaban (ELIQUIS) 5 MG TABS tablet   Other Relevant Orders   Lipid Panel w/o Chol/HDL Ratio    Other Visit Diagnoses    Annual physical exam    -  Primary       Follow up plan: Return in about 6 months (around  08/04/2017).

## 2017-02-05 ENCOUNTER — Encounter: Payer: Self-pay | Admitting: Unknown Physician Specialty

## 2017-02-05 LAB — LIPID PANEL W/O CHOL/HDL RATIO
CHOLESTEROL TOTAL: 180 mg/dL (ref 100–199)
HDL: 58 mg/dL (ref >39)
LDL Calculated: 95 mg/dL (ref 0–99)
TRIGLYCERIDES: 133 mg/dL (ref 0–149)
VLDL Cholesterol Cal: 27 mg/dL (ref 5–40)

## 2017-02-07 ENCOUNTER — Telehealth: Payer: Self-pay | Admitting: Unknown Physician Specialty

## 2017-02-07 ENCOUNTER — Emergency Department
Admission: EM | Admit: 2017-02-07 | Discharge: 2017-02-07 | Disposition: A | Discharge status: Home / Self Care | Payer: Medicare Other | Attending: Emergency Medicine | Admitting: Emergency Medicine

## 2017-02-07 ENCOUNTER — Emergency Department: Payer: Medicare Other

## 2017-02-07 DIAGNOSIS — W1839XA Other fall on same level, initial encounter: Secondary | ICD-10-CM | POA: Insufficient documentation

## 2017-02-07 DIAGNOSIS — S62101A Fracture of unspecified carpal bone, right wrist, initial encounter for closed fracture: Secondary | ICD-10-CM

## 2017-02-07 DIAGNOSIS — Y929 Unspecified place or not applicable: Secondary | ICD-10-CM | POA: Insufficient documentation

## 2017-02-07 DIAGNOSIS — Z87891 Personal history of nicotine dependence: Secondary | ICD-10-CM | POA: Insufficient documentation

## 2017-02-07 DIAGNOSIS — Z79899 Other long term (current) drug therapy: Secondary | ICD-10-CM | POA: Diagnosis not present

## 2017-02-07 DIAGNOSIS — Y9301 Activity, walking, marching and hiking: Secondary | ICD-10-CM | POA: Diagnosis not present

## 2017-02-07 DIAGNOSIS — S52571A Other intraarticular fracture of lower end of right radius, initial encounter for closed fracture: Secondary | ICD-10-CM | POA: Diagnosis not present

## 2017-02-07 DIAGNOSIS — Y999 Unspecified external cause status: Secondary | ICD-10-CM | POA: Insufficient documentation

## 2017-02-07 DIAGNOSIS — S6991XA Unspecified injury of right wrist, hand and finger(s), initial encounter: Secondary | ICD-10-CM | POA: Diagnosis present

## 2017-02-07 DIAGNOSIS — I10 Essential (primary) hypertension: Secondary | ICD-10-CM | POA: Insufficient documentation

## 2017-02-07 DIAGNOSIS — Z853 Personal history of malignant neoplasm of breast: Secondary | ICD-10-CM | POA: Diagnosis not present

## 2017-02-07 MED ORDER — OXYCODONE-ACETAMINOPHEN 5-325 MG PO TABS
ORAL_TABLET | ORAL | Status: AC
  Administered 2017-02-07: 1 via ORAL
  Filled 2017-02-07: qty 1

## 2017-02-07 MED ORDER — OXYCODONE-ACETAMINOPHEN 5-325 MG PO TABS: 1.0000 | ORAL_TABLET | ORAL | 0 refills | Status: DC | PRN

## 2017-02-07 MED ORDER — OXYCODONE-ACETAMINOPHEN 5-325 MG PO TABS
1.0000 | ORAL_TABLET | Freq: Once | ORAL | Status: AC
  Administered 2017-02-07: 1 via ORAL

## 2017-02-07 NOTE — ED Triage Notes (Signed)
Pt states her dog pulled her down chasing cats and injured her right wrist with noted swelling and eccymosis.Marland Kitchen

## 2017-02-07 NOTE — ED Provider Notes (Signed)
[facility] Emergency Department Provider Note   ____________________________________________   First MD Initiated Contact with Patient 02/07/17 [phone]     (approximate)  I have reviewed the triage vital signs and the nursing notes.   HISTORY  Chief Complaint Wrist Pain   HPI Melanie Wallace is a 71 y.o. female chief complaint of right wrist pain. Patient states that she fell while she was walking her dog. She states that he pulled on the leash causing her to fall. She denies any head injury or loss of consciousness. Patient states she has continued to have pain at her wrist but no other injuries.She denies any previous injury to her wrist. Currently she rates her pain as an 8/10. She has not taken any over-the-counter medication prior to arrival in the emergency room.   Past Medical History:  Diagnosis Date  . Allergic rhinitis   . Atrial fibrillation ([place])   . Breast CA ([place])   . Depression   . Hypertension   . Osteoporosis   . Vaginal cancer [facility])     Patient Active Problem List   Diagnosis Date Noted  . Atrial fibrillation ([place]) 02/04/2017  . Osteoporosis 07/14/2015  . Vaginal cancer ([place]) 07/14/2015  . Breast cancer ([place]) 07/14/2015  . Allergic rhinitis 07/14/2015  . Hypertension 07/14/2015  . Depression 07/14/2015    Past Surgical History:  Procedure Laterality Date  . BREAST SURGERY  03/2015  . NASAL SEPTUM SURGERY    . TUBAL LIGATION      Prior to Admission medications   Medication Sig Start Date End Date Taking? Authorizing Provider  apixaban (ELIQUIS) 5 MG TABS tablet Take 5 mg by mouth 2 (two) times daily. 09/24/16 09/24/17  Historical Provider, MD  calcium-vitamin D (OSCAL WITH D) 500-200 MG-UNIT per tablet Take 1 tablet by mouth.    Historical Provider, MD  citalopram (CELEXA) 40 MG tablet Take 1 tablet (40 mg total) by mouth at bedtime. 02/04/17   Kathrine Haddock, NP  losartan-hydrochlorothiazide (HYZAAR) 50-12.5 MG tablet Take  1 tablet by mouth daily. 02/04/17   Kathrine Haddock, NP  metoprolol succinate (TOPROL-XL) 25 MG 24 hr tablet Take 25 mg by mouth daily. 09/24/16 09/24/17  Historical Provider, MD  oxyCODONE-acetaminophen (PERCOCET) 5-325 MG tablet Take 1 tablet by mouth every 4 (four) hours as needed for severe pain. 02/07/17   Johnn Hai, PA-C    Allergies Sulfa antibiotics; Penicillin g benzathine; Green tea leaf ext; and Pen-vee k [penicillin v]  Family History  Problem Relation Age of Onset  . Cancer Mother     breast and bone  . Hypertension Mother   . Cancer Father     prostate  . Hypertension Sister   . Hypertension Daughter   . Hypothyroidism Daughter   . Allergies Daughter   . Hyperlipidemia Maternal Grandmother   . Heart attack Maternal Grandmother   . Stroke Paternal Grandfather     Social History Social History  Substance Use Topics  . Smoking status: Former Research scientist (life sciences)  . Smokeless tobacco: Never Used  . Alcohol use No    Review of Systems Constitutional: No fever/chills Eyes: No visual changes. ENT: No trauma Cardiovascular: Denies chest pain. Respiratory: Denies shortness of breath. Gastrointestinal: No abdominal pain.  No nausea, no vomiting.  Musculoskeletal: Pain right wrist. Skin: Negative for rash. Neurological: Negative for headaches, focal weakness or numbness.  10-point ROS otherwise negative.  ____________________________________________   PHYSICAL EXAM:  VITAL SIGNS: ED Triage Vitals  Enc Vitals Group  BP 02/07/17 0826 (!) 141/77     Pulse Rate 02/07/17 0826 82     Resp 02/07/17 0826 18     Temp 02/07/17 0826 97.9 F (36.6 C)     Temp Source 02/07/17 0826 Oral     SpO2 02/07/17 0826 99 %     Weight 02/07/17 0822 154 lb (69.9 kg)     Height 02/07/17 0822 5\' 7"  (1.702 m)     Head Circumference --      Peak Flow --      Pain Score 02/07/17 0822 8     Pain Loc --      Pain Edu? --      Excl. in [place]? --     Constitutional: Alert and oriented. Well  appearing and in no acute distress. Eyes: Conjunctivae are normal. PERRL. EOMI. Head: Atraumatic. Nose: No congestion/rhinnorhea. Neck: No stridor.   Cardiovascular: Normal rate, regular rhythm. Grossly normal heart sounds.  Good peripheral circulation. Respiratory: Normal respiratory effort.  No retractions. Lungs CTAB. Gastrointestinal: Soft and nontender. No distention.  Musculoskeletal: Examination of the right wrist there is no gross deformity however there is moderate soft tissue swelling present. Range of motion is restricted secondary to patient's pain. Capillary refill and motor sensory function are within normal limits. Neurologic:  Normal speech and language. No gross focal neurologic deficits are appreciated. No gait instability. Skin:  Skin is warm, dry and intact. No abrasions, ecchymosis or erythema was noted. There is soft tissue swelling at the distal portion of her radius and ulnar. Psychiatric: Mood and affect are normal. Speech and behavior are normal.  ____________________________________________   LABS (all labs ordered are listed, but only abnormal results are displayed)  Labs Reviewed - No data to display   RADIOLOGY  X-ray right wrist per radiologist shows mildly displaced fracture of the distal radius with intra-articular involvement. 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  I, Johnn Hai, personally viewed and evaluated these images (plain radiographs) as part of my medical decision making, as well as reviewing the written report by the radiologist. ____________________________________________   PROCEDURES  Procedure(s) performed: None  Procedures  Critical Care performed: No  ____________________________________________   INITIAL IMPRESSION / ASSESSMENT AND PLAN / ED COURSE  Pertinent labs & imaging results that were available during my care of the patient were reviewed by me and considered in my medical decision  making (see chart for details).  Patient was placed in an OCL wrist splint and sling. Patient was given prescription for Percocet as needed for pain. Patient is to call Dr. Rudene Christians office today to get an appointment for follow-up in the office. Patient was instructed not to take the splint off until seen by the orthopedist. Ice and elevation is needed today to decreased swelling and reduce pain. Patient was made aware that pain medication could cause drowsiness and increase her risk for falling.      ____________________________________________   FINAL CLINICAL IMPRESSION(S) / ED DIAGNOSES  Final diagnoses:  Other closed intra-articular fracture of distal end of right radius, initial encounter      NEW MEDICATIONS STARTED DURING THIS VISIT:  Discharge Medication List as of 02/07/2017  9:40 AM    START taking these medications   Details  oxyCODONE-acetaminophen (PERCOCET) 5-325 MG tablet Take 1 tablet by mouth every 4 (four) hours as needed for severe pain., Starting Thu 02/07/2017, Print         Note:  This document was prepared using Dragon voice  recognition software and may include unintentional dictation errors.    Johnn Hai, PA-C 02/07/17 [place] Quigley, MD 02/07/17 [phone]

## 2017-02-07 NOTE — Telephone Encounter (Signed)
Called and let patient know that [facility] sent her a letter with lab results stating that the results were normal. Will route to provider for referral to Dr. Rudene Christians. Patient stated she fractured her right wrist.

## 2017-02-07 NOTE — ED Notes (Signed)
Pt presents to ED with c/o R wrist pain. Pt states was walking her dog when he pulled on the leash causing her to fall. Pt presents with bruising and swelling to the R forearm and R wrist. Pt maintains movement, sensation, and pulses are intact. Cap fill < 3 seconds at this time. Pt denies LOC at this time.

## 2017-02-07 NOTE — Discharge Instructions (Signed)
Ice and elevate to decrease swelling and reduce pain. Call today for an appointment with Dr. Rudene Christians who is in the [facility] department. He is the orthopedist on call today. Percocet every 4 hours if needed for pain. Do not take extra Tylenol with this medication as it already has Tylenol in it. You may take ibuprofen if needed for extra patrol of pain. Do not take the splint off until seen by the orthopedist.

## 2017-02-08 DIAGNOSIS — S62109A Fracture of unspecified carpal bone, unspecified wrist, initial encounter for closed fracture: Secondary | ICD-10-CM | POA: Insufficient documentation

## 2017-02-08 NOTE — Telephone Encounter (Signed)
Called and let patient know that referral was entered as requested.

## 2017-02-12 ENCOUNTER — Ambulatory Visit: Payer: Self-pay | Admitting: Unknown Physician Specialty

## 2017-02-13 DIAGNOSIS — S6991XA Unspecified injury of right wrist, hand and finger(s), initial encounter: Secondary | ICD-10-CM | POA: Diagnosis not present

## 2017-02-13 DIAGNOSIS — S52571A Other intraarticular fracture of lower end of right radius, initial encounter for closed fracture: Secondary | ICD-10-CM | POA: Diagnosis not present

## 2017-02-19 DIAGNOSIS — S52571D Other intraarticular fracture of lower end of right radius, subsequent encounter for closed fracture with routine healing: Secondary | ICD-10-CM | POA: Diagnosis not present

## 2017-02-24 ENCOUNTER — Other Ambulatory Visit: Payer: Self-pay | Admitting: Unknown Physician Specialty

## 2017-03-08 DIAGNOSIS — S52571D Other intraarticular fracture of lower end of right radius, subsequent encounter for closed fracture with routine healing: Secondary | ICD-10-CM | POA: Diagnosis not present

## 2017-03-20 DIAGNOSIS — S52571D Other intraarticular fracture of lower end of right radius, subsequent encounter for closed fracture with routine healing: Secondary | ICD-10-CM | POA: Diagnosis not present

## 2017-04-11 DIAGNOSIS — S52571D Other intraarticular fracture of lower end of right radius, subsequent encounter for closed fracture with routine healing: Secondary | ICD-10-CM | POA: Diagnosis not present

## 2017-04-11 DIAGNOSIS — M65321 Trigger finger, right index finger: Secondary | ICD-10-CM | POA: Diagnosis not present

## 2017-04-18 ENCOUNTER — Ambulatory Visit: Payer: Medicare Other | Attending: Orthopedic Surgery | Admitting: Occupational Therapy

## 2017-04-18 DIAGNOSIS — M6281 Muscle weakness (generalized): Secondary | ICD-10-CM

## 2017-04-18 DIAGNOSIS — M25531 Pain in right wrist: Secondary | ICD-10-CM

## 2017-04-18 DIAGNOSIS — M79641 Pain in right hand: Secondary | ICD-10-CM | POA: Insufficient documentation

## 2017-04-18 DIAGNOSIS — M25631 Stiffness of right wrist, not elsewhere classified: Secondary | ICD-10-CM | POA: Diagnosis not present

## 2017-04-18 NOTE — Patient Instructions (Signed)
Heat Opposition AROM to all digits Prayer stretch and wrist flexion PROM  10 reps   2 -3 x day   PROM for wrist extention /flexion/RD and UD over edge of table  10 reps  AROM for wrist in all planes including supination  10 reps  3 x day  Stop when feeling slight pull and pain less than 1-2/10 - only slight pull or stretch   Wrist and thumb neoprene splint to wear during act to decrease pain

## 2017-04-18 NOTE — Therapy (Signed)
[place] [facility] [address] [place], [address] Phone: [phone]   Fax:  [phone]  Occupational Therapy Evaluation  Patient Details  Name: Melanie Wallace MRN: [id number] Date of Birth: May 11, 1946 Referring Provider: Arvella Nigh  Encounter Date: 04/18/2017      OT End of Session - 04/18/17 1837    Visit Number 1   Number of Visits 8   Date for OT Re-Evaluation 05/16/17   OT Start Time 0933   OT Stop Time 1039   OT Time Calculation (min) 66 min   Activity Tolerance Patient tolerated treatment well   Behavior During Therapy [facility] for tasks assessed/performed      Past Medical History:  Diagnosis Date  . Allergic rhinitis   . Atrial fibrillation ([place])   . Breast CA ([place])   . Depression   . Hypertension   . Osteoporosis   . Vaginal cancer [facility])     Past Surgical History:  Procedure Laterality Date  . BREAST SURGERY  03/2015  . NASAL SEPTUM SURGERY    . TUBAL LIGATION      There were no vitals filed for this visit.      Subjective Assessment - 04/18/17 0953    Subjective  Fell while walking dog 2/21 and ER 2/22 - was in cast for about 4 wks and then wrist splint - and then last week refer to OT for ROM for wrist and pain -    Patient Stated Goals Want to get the use of my R hand , no pain and good strength - open jars, lifting pots and pans, walk dog, remote control, carry gallon , bathing    Currently in Pain? Yes   Pain Score 2    Pain Location Wrist   Pain Orientation Right   Pain Descriptors / Indicators Aching   Pain Type Acute pain           [facility] OT Assessment - 04/18/17 0001      Assessment   Diagnosis R wrist distal radius fx   Referring Provider gaines   Onset Date 03/07/17     Precautions   Required Braces or Orthoses --  wrap for wrist     Home  Environment   Lives With Spouse     Prior Function   Vocation Retired   Leisure R hand dominant - rescue dog, walk, thrift  shop, house work ,  read ,play games on laptop     AROM   Right Forearm Pronation 90 Degrees   Right Forearm Supination 60 Degrees   Right Wrist Extension 38 Degrees   Right Wrist Flexion 45 Degrees   Right Wrist Radial Deviation 18 Degrees   Right Wrist Ulnar Deviation 28 Degrees   Left Wrist Extension 55 Degrees   Left Wrist Flexion 90 Degrees   Left Wrist Radial Deviation 20 Degrees   Left Wrist Ulnar Deviation 35 Degrees     Strength   Right Hand Grip (lbs) 20   Right Hand Lateral Pinch 6 lbs   Right Hand 3 Point Pinch 6 lbs   Left Hand Grip (lbs) 40-   Left Hand Lateral Pinch 14 lbs   Left Hand 3 Point Pinch 10 lbs       Fluido therapy done prior to review of HEP -AROM for wrist and digits in fluido to increase ROM and decrease stiffness and pain   Review HEP and handout provided   to do Heat Opposition AROM  to all digits Prayer stretch and wrist flexion PROM  10 reps   2 -3 x day   PROM for wrist extention /flexion/RD and UD over edge of table  10 reps  AROM for wrist in all planes including supination  10 reps  3 x day  Stop when feeling slight pull and pain less than 1-2/10 - only slight pull or stretch   Wrist and thumb neoprene splint to wear during act to decrease pain                   OT Education - 04/18/17 1836    Education provided Yes   Education Details HEP and findings of eval   Person(s) Educated Patient   Methods Demonstration;Explanation;Tactile cues;Verbal cues   Comprehension Verbal cues required;Returned demonstration;Verbalized understanding          OT Short Term Goals - 04/18/17 1848      OT SHORT TERM GOAL #1   Title Pain improve to less than 3/10 at the worse at the end of day    Baseline pain at the worse 9/10 - after using it during day - shifting gear 4-5/10   Time 3   Period Weeks   Status New     OT SHORT TERM GOAL #2   Title R wrist AROM improve with  at least 10 degrees in flexion, ext, supination to do  laundry , clean, cook    Baseline see flowsheet for ROM    Time 3   Period Weeks   Status New           OT Long Term Goals - 04/18/17 1853      OT LONG TERM GOAL #1   Title R hand Gripping and opposition improve WNL and no pain to use remote , lift pots, fasten bra   Baseline pain in palm , over thumb CMC and volar wrist during gripping and opposition - increase to 5/10- did had pain over A1pulley of 2nd digit in [facility] office   Time 4   Period Weeks   Status New     OT LONG TERM GOAL #2   Title R grip strength improve with more than 7 lbs to lift pots, change gears, carry groceries, feed dog    Baseline R grip 20 , L 40    Time 4   Period Weeks   Status New     OT LONG TERM GOAL #3   Title R prehension strength increase with 2-3 lbs to use remote, open package without increase symptoms    Baseline See flowsheet   Time 4   Period Weeks   Status New     OT LONG TERM GOAL #4   Title Function improve on PRWHE with score less than 10/50   Baseline NT - pain increase to 9/50 - decrease use because of stiffness and pain - will do PRWHE next session    Time 4   Period Weeks   Status New               Plan - 04/18/17 1839    Clinical Impression Statement Pt is about 9 wks out from R distal radius fracture - pt present with decrease wrist  AROM in all planes, with increase pain - pt also have increase pain with fisting and opposition in palm and over volar wrist -  pt do have AROM in digits WNL except 2nd MC flexion- pt did have pain over A1pulley at 2nd digit  last week at [facility] but did not had  in session - pt show decrease grip and prehension - all limiting her fu   Rehab Potential Good   OT Frequency 2x / week   OT Duration 4 weeks   OT Treatment/Interventions Self-care/ADL training;Fluidtherapy;Splinting;Patient/family education;Therapeutic exercises;Ultrasound;Passive range of motion;Parrafin;Iontophoresis;Manual Therapy   Plan assess progress with splint use , HEP    OT  Home Exercise Plan See pt instruction   Consulted and Agree with Plan of Care Patient      Patient will benefit from skilled therapeutic intervention in order to improve the following deficits and impairments:  Decreased range of motion, Impaired flexibility, Increased edema, Decreased knowledge of use of DME, Decreased strength, Impaired UE functional use, Pain  Visit Diagnosis: Pain in right hand - Plan: Ot plan of care cert/re-cert  Pain in right wrist - Plan: Ot plan of care cert/re-cert  Stiffness of right wrist, not elsewhere classified - Plan: Ot plan of care cert/re-cert  Muscle weakness (generalized) - Plan: Ot plan of care cert/re-cert    Problem List Patient Active Problem List   Diagnosis Date Noted  . Wrist fracture 02/08/2017  . Atrial fibrillation ([place]) 02/04/2017  . Osteoporosis 07/14/2015  . Vaginal cancer ([place]) 07/14/2015  . Breast cancer ([place]) 07/14/2015  . Allergic rhinitis 07/14/2015  . Hypertension 07/14/2015  . Depression 07/14/2015    Rosalyn Gess OTR/L,CLT 04/18/2017, 7:06 PM  [place] [facility] [address] [place], [address] Phone: [phone]   Fax:  [phone]  Name: Melanie Wallace MRN: [id number] Date of Birth: 24-Mar-1946

## 2017-04-23 ENCOUNTER — Ambulatory Visit: Payer: Medicare Other | Admitting: Occupational Therapy

## 2017-04-23 DIAGNOSIS — M6281 Muscle weakness (generalized): Secondary | ICD-10-CM | POA: Diagnosis not present

## 2017-04-23 DIAGNOSIS — M79641 Pain in right hand: Secondary | ICD-10-CM | POA: Diagnosis not present

## 2017-04-23 DIAGNOSIS — M25531 Pain in right wrist: Secondary | ICD-10-CM | POA: Diagnosis not present

## 2017-04-23 DIAGNOSIS — M25631 Stiffness of right wrist, not elsewhere classified: Secondary | ICD-10-CM | POA: Diagnosis not present

## 2017-04-23 NOTE — Therapy (Signed)
[facility] [address] [place], [address] Phone: [phone]   Fax:  [phone]  Occupational Therapy Treatment  Patient Details  Name: Melanie Wallace MRN: [id number] Date of Birth: [birth year] Referring Provider: Arvella Nigh  Encounter Date: 04/23/2017      OT End of Session - 04/23/17 0956    Visit Number 2   Number of Visits 8   Date for OT Re-Evaluation 05/16/17   OT Start Time 0945   OT Stop Time 1029   OT Time Calculation (min) 44 min   Activity Tolerance Patient tolerated treatment well   Behavior During Therapy [facility] for tasks assessed/performed      Past Medical History:  Diagnosis Date  . Allergic rhinitis   . Atrial fibrillation ([place])   . Breast CA ([place])   . Depression   . Hypertension   . Osteoporosis   . 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 BREAST SURGERY  03/2015  . NASAL SEPTUM SURGERY    . TUBAL LIGATION      There were no vitals filed for this visit.      Subjective Assessment - 04/23/17 0947    Subjective  Feel like llittle  more movement  and still pain at the wrist - hand pain better - wearing soft splint - it helps with pain    Patient Stated Goals Want to get the use of my R hand , no pain and good strength - open jars, lifting pots and pans, walk dog, remote control, carry gallon , bathing    Currently in Pain? No/denies                      OT Treatments/Exercises (OP) - 04/23/17 0001      RUE Fluidotherapy   Number Minutes Fluidotherapy 10 Minutes   RUE Fluidotherapy Location Hand;Wrist   Comments AROM in wrist in all planes - digits flexion to increase ROM and decrease pain       Fluido therapy done at [facility] with AROM wrist and hand  Soft tissue mobs to volar wrist and palm , over CT and volar digits - to decrease pain  Tool nr 2 Graston sweeping , scooping  And Carpal spreads done  Prior to ROM     Opposition AROM to all digits Prayer stretch and wrist flexion PROM  10 reps  Reviewed HEP for  PROM for wrist extention /flexion/RD and UD over edge of table  10 reps - needed min A and min v/c 1 lbs weight for wrist flexion and extention - 10 reps  16 oz hammer for sup/pro, wrist RD and UD  Needed Mod A and max v/c - for correct alignment  10 reps   Add to HEP and reviewed light blue putty for gripping and pulling with all digits  15-20 reps  2 x day   Wrist and thumb neoprene splint to wear during act to decrease pain             OT Education - 04/23/17 0956    Education provided Yes   Education Details upgrade HEP    Person(s) Educated Patient   Methods Explanation;Demonstration;Tactile cues;Verbal cues   Comprehension Verbal cues required;Returned demonstration;Verbalized understanding          OT Short Term Goals - 04/18/17 1848      OT SHORT TERM GOAL #1   Title Pain  improve to less than 3/10 at the worse at the end of day    Baseline pain at the worse 9/10 - after using it during day - shifting gear 4-5/10 -On PRWHE pain 24/50   Time 3   Period Weeks   Status New     OT SHORT TERM GOAL #2   Title R wrist AROM improve with  at least 10 degrees in flexion, ext, supination to do laundry , clean, cook    Baseline see flowsheet for ROM    Time 3   Period Weeks   Status New           OT Long Term Goals - 04/18/17 1853      OT LONG TERM GOAL #1   Title R hand Gripping and opposition improve WNL and no pain to use remote , lift pots, fasten bra   Baseline pain in palm , over thumb CMC and volar wrist during gripping and opposition - increase to 5/10- did had pain over A1pulley of 2nd digit in MD office   Time 4   Period Weeks   Status New     OT LONG TERM GOAL #2   Title R grip strength improve with more than 7 lbs to lift pots, change gears, carry groceries, feed dog    Baseline R grip 20 , L 40    Time 4   Period Weeks   Status New      OT LONG TERM GOAL #3   Title R prehension strength increase with 2-3 lbs to use remote, open package without increase symptoms    Baseline See flowsheet   Time 4   Period Weeks   Status New     OT LONG TERM GOAL #4   Title Function improve on PRWHE with score less than 10/50   Baseline NT - pain increase to 9/50 - decrease use because of stiffness and pain - will do PRWHE next session - add as addendum 21/50 at eval    Time 4   Period Weeks   Status New               Plan - 04/23/17 0093    Clinical Impression Statement Pt report decrease pain at home - but still present but not as intense , increase supination - pt showing progress and able to add some strengthening this date    Rehab Potential Good   OT Frequency 2x / week   OT Duration 4 weeks   OT Treatment/Interventions Self-care/ADL training;Fluidtherapy;Splinting;Patient/family education;Therapeutic exercises;Ultrasound;Passive range of motion;Parrafin;Iontophoresis;Manual Therapy   Plan assess progress in ROM with HEP- upgrade putty   OT Home Exercise Plan See pt instruction   Consulted and Agree with Plan of Care Patient      Patient will benefit from skilled therapeutic intervention in order to improve the following deficits and impairments:  Decreased range of motion, Impaired flexibility, Increased edema, Decreased knowledge of use of DME, Decreased strength, Impaired UE functional use, Pain  Visit Diagnosis: Pain in right hand  Pain in right wrist  Stiffness of right wrist, not elsewhere classified  Muscle weakness (generalized)    Problem List Patient Active Problem List   Diagnosis Date Noted  . Wrist fracture 02/08/2017  . Atrial fibrillation ([place]) 02/04/2017  . Osteoporosis 07/14/2015  . 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 Breast cancer ([place]) 07/14/2015  . Allergic rhinitis 07/14/2015  . Hypertension 07/14/2015  . Depression 07/14/2015    Rosalyn Gess OTR/L,CLT 04/23/2017, 1:17  PM  [facility] [address] [place], [address] Phone: [phone]   Fax:  [phone]  Name: [name] MRN: [id number] Date of Birth: 1946/04/07

## 2017-04-23 NOTE — Patient Instructions (Addendum)
Same HEP  But add  1 lbs weight for wrist flexion and extention - 10 reps  16 oz hammer for sup/pro, wrist RD and UD  Needed Mod A and max v/c - for correct alignment  10 reps   Add to HEP  light blue putty for gripping and pulling with all digits  15-20 reps  2 x day   Wrist and thumb neoprene splint to wear during act to decrease pain

## 2017-04-25 ENCOUNTER — Ambulatory Visit: Payer: Medicare Other | Admitting: Occupational Therapy

## 2017-04-25 DIAGNOSIS — M79641 Pain in right hand: Secondary | ICD-10-CM

## 2017-04-25 DIAGNOSIS — M25631 Stiffness of right wrist, not elsewhere classified: Secondary | ICD-10-CM | POA: Diagnosis not present

## 2017-04-25 DIAGNOSIS — M6281 Muscle weakness (generalized): Secondary | ICD-10-CM | POA: Diagnosis not present

## 2017-04-25 DIAGNOSIS — M25531 Pain in right wrist: Secondary | ICD-10-CM | POA: Diagnosis not present

## 2017-04-25 NOTE — Therapy (Signed)
[facility] [address] [place], [address] Phone: [phone]   Fax:  [phone]  Occupational Therapy Treatment  Patient Details  Name: Melanie Wallace MRN: [id number] Date of Birth: [birth year] Referring Provider: Arvella Nigh  Encounter Date: 04/25/2017      OT End of Session - 04/25/17 0911    Visit Number 3   Number of Visits 8   Date for OT Re-Evaluation 05/16/17   OT Start Time 0855   OT Stop Time 0944   OT Time Calculation (min) 49 min   Activity Tolerance Patient tolerated treatment well   Behavior During Therapy [facility] for tasks assessed/performed      Past Medical History:  Diagnosis Date  . Allergic rhinitis   . Atrial fibrillation ([place])   . Breast CA ([place])   . Depression   . Hypertension   . Osteoporosis   . 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 BREAST SURGERY  03/2015  . NASAL SEPTUM SURGERY    . TUBAL LIGATION      There were no vitals filed for this visit.      Subjective Assessment - 04/25/17 0904    Subjective  I had no sharp shooting pain since last time - no pain in hand - did hammer exercises - feels really good    Patient Stated Goals Want to get the use of my R hand , no pain and good strength - open jars, lifting pots and pans, walk dog, remote control, carry gallon , bathing    Currently in Pain? No/denies            [facility] OT Assessment - 04/25/17 0001      AROM   Right Forearm Supination 75 Degrees   Right Wrist Extension 54 Degrees   Right Wrist Flexion 63 Degrees   Right Wrist Radial Deviation 22 Degrees   Right Wrist Ulnar Deviation 28 Degrees                  OT Treatments/Exercises (OP) - 04/25/17 0001      RUE Fluidotherapy   Number Minutes Fluidotherapy 10 Minutes   RUE Fluidotherapy Location Hand;Wrist   Comments AROM for wrist in all planes to increase ROM at [facility]       Measurements taken for  ROM - see flowsheet   Fluido therapy done at [facility] with AROM wrist and hand  Soft tissue mobs to volar wrist and palm , over CT and volar digits - to decrease pain  Tool nr 2 Graston sweeping , scooping  And Carpal spreads done  Prior to ROM    PROM by OT in all planes CPM on BTE for wrist extention and flexion 200 sec each  2 lbs for wrist ext place and hold  BTE for wrist flexion - 18lbs on tool 701 - 120 sec  Sup tool 601 120 sec at 1 lbs  RD and UD 2 lbs 12 reps no pain   reviewed  puttyHEP but add 1/2 light green to light blue for gripping and pulling with all digits  And add lat and 3 point grip  - pt needed max A and max v/c for Lat grip HEP  12 reps each  2 x day             OT Education - 04/25/17 0910    Education provided Yes   Education Details update  HEP    Person(s) Educated Patient   Methods Explanation;Demonstration;Tactile cues;Verbal cues   Comprehension Verbalized understanding;Verbal cues required;Returned demonstration          OT Short Term Goals - 04/18/17 1848      OT SHORT TERM GOAL #1   Title Pain improve to less than 3/10 at the worse at the end of day    Baseline pain at the worse 9/10 - after using it during day - shifting gear 4-5/10 -On PRWHE pain 24/50   Time 3   Period Weeks   Status New     OT SHORT TERM GOAL #2   Title R wrist AROM improve with  at least 10 degrees in flexion, ext, supination to do laundry , clean, cook    Baseline see flowsheet for ROM    Time 3   Period Weeks   Status New           OT Long Term Goals - 04/18/17 1853      OT LONG TERM GOAL #1   Title R hand Gripping and opposition improve WNL and no pain to use remote , lift pots, fasten bra   Baseline pain in palm , over thumb CMC and volar wrist during gripping and opposition - increase to 5/10- did had pain over A1pulley of 2nd digit in MD office   Time 4   Period Weeks   Status New     OT LONG TERM GOAL #2   Title R grip strength improve with  more than 7 lbs to lift pots, change gears, carry groceries, feed dog    Baseline R grip 20 , L 40    Time 4   Period Weeks   Status New     OT LONG TERM GOAL #3   Title R prehension strength increase with 2-3 lbs to use remote, open package without increase symptoms    Baseline See flowsheet   Time 4   Period Weeks   Status New     OT LONG TERM GOAL #4   Title Function improve on PRWHE with score less than 10/50   Baseline NT - pain increase to 9/50 - decrease use because of stiffness and pain - will do PRWHE next session - add as addendum 21/50 at eval    Time 4   Period Weeks   Status New               Plan - 04/25/17 8676    Clinical Impression Statement Pt cont to show progress in ROM and strength in R wrist and hand - report increase use and decrease pain - pt to cont with HEP upgrade    Rehab Potential Good   OT Frequency 2x / week   OT Duration 4 weeks   OT Treatment/Interventions Self-care/ADL training;Fluidtherapy;Splinting;Patient/family education;Therapeutic exercises;Ultrasound;Passive range of motion;Parrafin;Iontophoresis;Manual Therapy   Plan upgrade HEP as needed    OT Home Exercise Plan See pt instruction   Consulted and Agree with Plan of Care Patient      Patient will benefit from skilled therapeutic intervention in order to improve the following deficits and impairments:  Decreased range of motion, Impaired flexibility, Increased edema, Decreased knowledge of use of DME, Decreased strength, Impaired UE functional use, Pain  Visit Diagnosis: Pain in right hand  Pain in right wrist  Stiffness of right wrist, not elsewhere classified  Muscle weakness (generalized)    Problem List Patient Active Problem List   Diagnosis Date Noted  .  Wrist fracture 02/08/2017  . Atrial fibrillation ([place]) 02/04/2017  . Osteoporosis 07/14/2015  . 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 Breast cancer ([place]) 07/14/2015  . Allergic rhinitis 07/14/2015  .  Hypertension 07/14/2015  . Depression 07/14/2015    Rosalyn Gess  OTR/L,CLT 04/25/2017, 6:04 PM  [facility] [address] [place], [address] Phone: [phone]   Fax:  [phone]  Name: Melanie Wallace MRN: [id number] Date of Birth: Nov 05, 1946

## 2017-04-25 NOTE — Patient Instructions (Addendum)
Same HEP but increase putty resistance reviewed  puttyHEP but add 1/2 light green to light blue for gripping and pulling with all digits  And add lat and 3 point grip  - pt needed max A and max v/c for Lat grip HEP  12 reps each  2 x day

## 2017-04-29 ENCOUNTER — Ambulatory Visit: Payer: Medicare Other | Admitting: Occupational Therapy

## 2017-04-29 DIAGNOSIS — M25531 Pain in right wrist: Secondary | ICD-10-CM | POA: Diagnosis not present

## 2017-04-29 DIAGNOSIS — M25631 Stiffness of right wrist, not elsewhere classified: Secondary | ICD-10-CM | POA: Diagnosis not present

## 2017-04-29 DIAGNOSIS — M79641 Pain in right hand: Secondary | ICD-10-CM

## 2017-04-29 DIAGNOSIS — M6281 Muscle weakness (generalized): Secondary | ICD-10-CM

## 2017-04-29 NOTE — Patient Instructions (Addendum)
Upgrade wrist HEP to 2 lbs weight  Same PROM for wrist and  Putty HEP

## 2017-04-29 NOTE — Therapy (Signed)
[facility] [address], [place], [address] Phone: [phone]   Fax:  [phone]  Occupational Therapy Treatment  Patient Details  Name: Melanie Wallace MRN: [id number] Date of Birth: Dec 23, 1945 Referring Provider: Arvella Nigh  Encounter Date: 04/29/2017      OT End of Session - 04/29/17 0954    Visit Number 4   Number of Visits 8   Date for OT Re-Evaluation 05/16/17   OT Start Time 0947   OT Stop Time 1029   OT Time Calculation (min) 42 min   Activity Tolerance Patient tolerated treatment well   Behavior During Therapy [facility] for tasks assessed/performed      Past Medical History:  Diagnosis Date  . Allergic rhinitis   . Atrial fibrillation ([place])   . Breast CA ([place])   . Depression   . Hypertension   . Osteoporosis   . 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 BREAST SURGERY  03/2015  . NASAL SEPTUM SURGERY    . TUBAL LIGATION      There were no vitals filed for this visit.      Subjective Assessment - 04/29/17 0948    Subjective  I did okay with the exercises - the putty is harder but I can do it - using it more and still no pain -    Patient Stated Goals Want to get the use of my R hand , no pain and good strength - open jars, lifting pots and pans, walk dog, remote control, carry gallon , bathing    Currently in Pain? No/denies            [facility] OT Assessment - 04/29/17 0001      AROM   Right Wrist Extension 60 Degrees   Right Wrist Flexion 68 Degrees     Strength   Right Hand Grip (lbs) 29   Right Hand Lateral Pinch 9 lbs   Right Hand 3 Point Pinch 8 lbs                  OT Treatments/Exercises (OP) - 04/29/17 0001      RUE Fluidotherapy   Number Minutes Fluidotherapy 10 Minutes   RUE Fluidotherapy Location Hand;Wrist   Comments AROM for wrist sup, flexion and extention in all planes  to increase ROM       Measurements  taken for ROM and grip/prehension strength - see flowsheet  Fluido therapy done at [facility] with AROM wrist and hand  Soft tissue mobs to volar wrist and palm , over CT and volar digits - to decrease pain  Tool nr 2 Graston sweeping , scooping  And Carpal spreads done  Prior to ROM   CPM on BTE for wrist extention and flexion 200 sec each  2 lbs for wrist ext place and hold  And actively - min v/c to stay midline BTE for wrist flexion - 20lbs on tool 701 - 120 sec  PROM for sup  2 lbs weight for supination 12 reps x 2  Sup tool 601 120 sec at 1 lbs  RD and UD 2 lbs 12 reps no pain - to side Gripper at 22 lbs 120 sec - started at 30 but had to decrease resistance -    Same HEP with putty (1/2 light green to light blue) - reviewed lat and 3 point grip  - pt needed min A and mod v/c  for Lat grip HEP  12 reps each             OT Education - 04/29/17 0954    Education provided Yes   Education Details upgrade to 2 lbs for wrist HEP    Person(s) Educated Patient   Methods Explanation;Demonstration;Tactile cues;Verbal cues   Comprehension Verbal cues required;Returned demonstration;Verbalized understanding          OT Short Term Goals - 04/18/17 1848      OT SHORT TERM GOAL #1   Title Pain improve to less than 3/10 at the worse at the end of day    Baseline pain at the worse 9/10 - after using it during day - shifting gear 4-5/10 -On PRWHE pain 24/50   Time 3   Period Weeks   Status New     OT SHORT TERM GOAL #2   Title R wrist AROM improve with  at least 10 degrees in flexion, ext, supination to do laundry , clean, cook    Baseline see flowsheet for ROM    Time 3   Period Weeks   Status New           OT Long Term Goals - 04/18/17 1853      OT LONG TERM GOAL #1   Title R hand Gripping and opposition improve WNL and no pain to use remote , lift pots, fasten bra   Baseline pain in palm , over thumb CMC and volar wrist during gripping and opposition - increase to  5/10- did had pain over A1pulley of 2nd digit in MD office   Time 4   Period Weeks   Status New     OT LONG TERM GOAL #2   Title R grip strength improve with more than 7 lbs to lift pots, change gears, carry groceries, feed dog    Baseline R grip 20 , L 40    Time 4   Period Weeks   Status New     OT LONG TERM GOAL #3   Title R prehension strength increase with 2-3 lbs to use remote, open package without increase symptoms    Baseline See flowsheet   Time 4   Period Weeks   Status New     OT LONG TERM GOAL #4   Title Function improve on PRWHE with score less than 10/50   Baseline NT - pain increase to 9/50 - decrease use because of stiffness and pain - will do PRWHE next session - add as addendum 21/50 at eval    Time 4   Period Weeks   Status New               Plan - 04/29/17 0955    Clinical Impression Statement Pt is making progress in ROM at wrist in all planes and grip strength- able to upgrade this date weigth for wrist - putty kept same - can upgrade next session possibly    Rehab Potential Good   OT Frequency 2x / week   OT Duration 4 weeks   OT Treatment/Interventions Self-care/ADL training;Fluidtherapy;Splinting;Patient/family education;Therapeutic exercises;Ultrasound;Passive range of motion;Parrafin;Iontophoresis;Manual Therapy   Plan upgrade HEP as needed - assess progress   OT Home Exercise Plan See pt instruction   Consulted and Agree with Plan of Care Patient      Patient will benefit from skilled therapeutic intervention in order to improve the following deficits and impairments:  Decreased range of motion, Impaired flexibility, Increased edema, Decreased knowledge of use of DME,  Decreased strength, Impaired UE functional use, Pain  Visit Diagnosis: Pain in right hand  Pain in right wrist  Stiffness of right wrist, not elsewhere classified  Muscle weakness (generalized)    Problem List Patient Active Problem List   Diagnosis Date Noted  .  Wrist fracture 02/08/2017  . Atrial fibrillation ([place]) 02/04/2017  . Osteoporosis 07/14/2015  . Vaginal cancer ([place]) 07/14/2015  . Breast cancer ([place]) 07/14/2015  . Allergic rhinitis 07/14/2015  . Hypertension 07/14/2015  . Depression 07/14/2015    Rosalyn Gess OTR/L,CLT 04/29/2017, 1:54 PM  [facility] [address] [place], [address] Phone: [phone]   Fax:  [phone]  Name: Melanie Wallace MRN: [id number] Date of Birth: 28-May-1946

## 2017-05-02 ENCOUNTER — Ambulatory Visit: Payer: Medicare Other | Admitting: Occupational Therapy

## 2017-05-02 DIAGNOSIS — M6281 Muscle weakness (generalized): Secondary | ICD-10-CM | POA: Diagnosis not present

## 2017-05-02 DIAGNOSIS — M25531 Pain in right wrist: Secondary | ICD-10-CM

## 2017-05-02 DIAGNOSIS — M25631 Stiffness of right wrist, not elsewhere classified: Secondary | ICD-10-CM

## 2017-05-02 DIAGNOSIS — M79641 Pain in right hand: Secondary | ICD-10-CM | POA: Diagnosis not present

## 2017-05-02 NOTE — Patient Instructions (Addendum)
Same HEP but focus on flexion and sup - stretches and PROM  Prior to strengthening

## 2017-05-02 NOTE — Therapy (Signed)
[facility] [address], [place], [address] Phone: [phone]   Fax:  [phone]  Occupational Therapy Treatment  Patient Details  Name: Melanie Wallace MRN: [id number] Date of Birth: 1946-10-16 Referring Provider: Arvella Nigh  Encounter Date: 05/02/2017      OT End of Session - 05/02/17 0946    Visit Number 5   Number of Visits 8   Date for OT Re-Evaluation 05/16/17   OT Start Time 0933   OT Stop Time 1017   OT Time Calculation (min) 44 min   Activity Tolerance Patient tolerated treatment well   Behavior During Therapy [facility] for tasks assessed/performed      Past Medical History:  Diagnosis Date  . Allergic rhinitis   . Atrial fibrillation ([place])   . Breast CA ([place])   . Depression   . Hypertension   . Osteoporosis   . 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 BREAST SURGERY  03/2015  . NASAL SEPTUM SURGERY    . TUBAL LIGATION      There were no vitals filed for this visit.      Subjective Assessment - 05/02/17 0934    Subjective  Cooking , vacuuming , driving better - putty still little hard and some pain - and doing 2 lbs weight - not trouble there    Patient Stated Goals Want to get the use of my R hand , no pain and good strength - open jars, lifting pots and pans, walk dog, remote control, carry gallon , bathing    Currently in Pain? No/denies      Measurements taken for ROM  Wrist flexion - decrease compare to last time for flexion - slight pull  Paraffin  done at  [facility] to increase wrist flexion  Soft tissue mobs to volar wrist and palm , over CT and volar digits - to decrease pain  Tool nr 2 Graston sweeping , scooping  And Carpal spreads done  Prior to ROM   CPM on BTE for wrist  flexion 200 s BTE for wrist flexion - 20lbs on tool 701 - 120 sec  PROM for sup  Sup tool 601 120 sec at 2 lbs  Large knob for RD and UD -  3 lbs 120 sec  Each  way Review with pt to focus on flexion and sup PROM at home to increase ROM                   OT Treatments/Exercises (OP) - 05/02/17 0001      RUE Paraffin   Number Minutes Paraffin 10 Minutes   RUE Paraffin Location Wrist   Comments at [facility] to increase wrist flexion - with heatingpad for flexion stretch       Measurements taken for ROM  Wrist flexion - decrease compare to last time for flexion - slight pull  Paraffin  done at  [facility] to increase wrist flexion  Soft tissue mobs to volar wrist and palm , over CT and volar digits - to decrease pain  Tool nr 2 Graston sweeping , scooping  And Carpal spreads done  Prior to ROM   CPM on BTE for wrist  flexion 200 s BTE for wrist flexion - 20lbs on tool 701 - 120 sec  PROM for sup  Sup tool 601 120 sec at 2 lbs  Large knob for RD and UD -  3 lbs  120 sec  Each way Review with pt to focus on flexion and sup PROM at home to increase ROM              OT Education - 05/02/17 0946    Education provided Yes   Education Details HEP what to focus on    Person(s) Educated Patient   Methods Explanation;Demonstration;Tactile cues;Verbal cues   Comprehension Verbal cues required;Returned demonstration;Verbalized understanding          OT Short Term Goals - 04/18/17 1848      OT SHORT TERM GOAL #1   Title Pain improve to less than 3/10 at the worse at the end of day    Baseline pain at the worse 9/10 - after using it during day - shifting gear 4-5/10 -On PRWHE pain 24/50   Time 3   Period Weeks   Status New     OT SHORT TERM GOAL #2   Title R wrist AROM improve with  at least 10 degrees in flexion, ext, supination to do laundry , clean, cook    Baseline see flowsheet for ROM    Time 3   Period Weeks   Status New           OT Long Term Goals - 04/18/17 1853      OT LONG TERM GOAL #1   Title R hand Gripping and opposition improve WNL and no pain to use remote , lift pots, fasten bra   Baseline pain in  palm , over thumb CMC and volar wrist during gripping and opposition - increase to 5/10- did had pain over A1pulley of 2nd digit in MD office   Time 4   Period Weeks   Status New     OT LONG TERM GOAL #2   Title R grip strength improve with more than 7 lbs to lift pots, change gears, carry groceries, feed dog    Baseline R grip 20 , L 40    Time 4   Period Weeks   Status New     OT LONG TERM GOAL #3   Title R prehension strength increase with 2-3 lbs to use remote, open package without increase symptoms    Baseline See flowsheet   Time 4   Period Weeks   Status New     OT LONG TERM GOAL #4   Title Function improve on PRWHE with score less than 10/50   Baseline NT - pain increase to 9/50 - decrease use because of stiffness and pain - will do PRWHE next session - add as addendum 21/50 at eval    Time 4   Period Weeks   Status New               Plan - 05/02/17 0947    Clinical Impression Statement Pt making progress in ROM , strength and use - pt flexion at wrist this date decrease - pt to focus on ROM prior to strengthening - wrist flexion and sup    Rehab Potential Good   OT Frequency 2x / week   OT Duration 4 weeks   OT Treatment/Interventions Self-care/ADL training;Fluidtherapy;Splinting;Patient/family education;Therapeutic exercises;Ultrasound;Passive range of motion;Parrafin;Iontophoresis;Manual Therapy   Plan assess flexion and sup of wrist - upgrade HEP as needed    OT Home Exercise Plan See pt instruction   Consulted and Agree with Plan of Care Patient      Patient will benefit from skilled therapeutic intervention in order to improve the following deficits and impairments:  Decreased range of motion, Impaired flexibility, Increased edema, Decreased knowledge of use of DME, Decreased strength, Impaired UE functional use, Pain  Visit Diagnosis: Pain in right hand  Pain in right wrist  Stiffness of right wrist, not elsewhere classified  Muscle weakness  (generalized)    Problem List Patient Active Problem List   Diagnosis Date Noted  . Wrist fracture 02/08/2017  . Atrial fibrillation ([place]) 02/04/2017  . Osteoporosis 07/14/2015  . 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 Breast cancer ([place]) 07/14/2015  . Allergic rhinitis 07/14/2015  . Hypertension 07/14/2015  . Depression 07/14/2015    Rosalyn Gess OTR/L,CLT 05/02/2017, 1:22 PM  [facility] [address], [place], [address] Phone: [phone]   Fax:  [phone]  Name: EMMALOU HUNGER MRN: [id number] Date of Birth: 1945-12-20

## 2017-05-06 ENCOUNTER — Ambulatory Visit: Payer: Medicare Other | Admitting: Occupational Therapy

## 2017-05-06 DIAGNOSIS — M79641 Pain in right hand: Secondary | ICD-10-CM

## 2017-05-06 DIAGNOSIS — M6281 Muscle weakness (generalized): Secondary | ICD-10-CM | POA: Diagnosis not present

## 2017-05-06 DIAGNOSIS — M25631 Stiffness of right wrist, not elsewhere classified: Secondary | ICD-10-CM

## 2017-05-06 DIAGNOSIS — M25531 Pain in right wrist: Secondary | ICD-10-CM | POA: Diagnosis not present

## 2017-05-06 NOTE — Patient Instructions (Addendum)
  PROM for sup and flexion wrist Cont with 2 lbs weight - 2-3 sets in all planes  Putty mix dark blue into teal -  Gripping, lat and 3 point grip - needed mod A to do correctly  Add twisting BW and FW and pulling with all digits  10 reps   2 sets - break in between - slow

## 2017-05-06 NOTE — Therapy (Signed)
[facility] [address], [place], [address] Phone: [phone]   Fax:  [phone]  Occupational Therapy Treatment  Patient Details  Name: Melanie Wallace MRN: [id number] Date of Birth: 1946/01/15 Referring Provider: Arvella Nigh  Encounter Date: 05/06/2017      OT End of Session - 05/06/17 0944    Visit Number 6   Number of Visits 8   Date for OT Re-Evaluation 05/16/17   OT Start Time 0915   OT Stop Time 1000   OT Time Calculation (min) 45 min   Activity Tolerance Patient tolerated treatment well;Patient limited by lethargy   Behavior During Therapy [facility] for tasks assessed/performed      Past Medical History:  Diagnosis Date  . Allergic rhinitis   . Atrial fibrillation ([place])   . Breast CA ([place])   . Depression   . Hypertension   . Osteoporosis   . 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 BREAST SURGERY  03/2015  . NASAL SEPTUM SURGERY    . TUBAL LIGATION      There were no vitals filed for this visit.      Subjective Assessment - 05/06/17 0916    Subjective  Don't have trouble with house work, bathing and dressing-  my fingers feels more normal - pain very little - maybe if I push it the end range     Patient Stated Goals Want to get the use of my R hand , no pain and good strength - open jars, lifting pots and pans, walk dog, remote control, carry gallon , bathing    Currently in Pain? No/denies            [facility] OT Assessment - 05/06/17 0001      AROM   Right Wrist Extension 70 Degrees   Right Wrist Flexion 68 Degrees     Strength   Right Hand Grip (lbs) 32   Right Hand Lateral Pinch 10 lbs   Right Hand 3 Point Pinch 9 lbs   Left Hand Grip (lbs) 40   Left Hand Lateral Pinch 14 lbs   Left Hand 3 Point Pinch 10 lbs                  OT Treatments/Exercises (OP) - 05/06/17 0001      RUE Fluidotherapy   Number Minutes Fluidotherapy  10 Minutes   RUE Fluidotherapy Location Hand;Wrist   Comments AROM focus on sup and flexion - prior to review of HEP        Measurements taken for ROM / grip and prehension - and PRWHE for pain 1/50 and function 0/50  fluido therapy done     PROM for sup and flexion wrist review with pt  Cont with 2 lbs weight - 2-3 sets in all planes  Putty mix dark blue into teal -  Gripping, lat and 3 point grip - needed mod A to do correctly  Add twisting BW and FW and pulling with all digits  10 reps   2 sets - break in between - slow             OT Education - 05/06/17 0944    Education provided Yes   Education Details HEP update - increase putty resistance    Person(s) Educated Patient   Methods Explanation;Demonstration;Tactile cues;Verbal cues   Comprehension Verbal cues required;Returned demonstration;Verbalized understanding  OT Short Term Goals - 05/06/17 0925      OT SHORT TERM GOAL #1   Title Pain improve to less than 3/10 at the worse at the end of day    Baseline pain at the worse 1/10    Status Achieved     OT SHORT TERM GOAL #2   Title R wrist AROM improve with  at least 10 degrees in flexion, ext, supination to do laundry , clean, cook    Baseline see flowsheet   Time 3   Period Weeks   Status On-going           OT Long Term Goals - 05/06/17 0946      OT LONG TERM GOAL #1   Title R hand Gripping and opposition improve WNL and no pain to use remote , lift pots, fasten bra   Status Achieved     OT LONG TERM GOAL #2   Title R grip strength improve with more than 7 lbs to lift pots, change gears, carry groceries, feed dog    Baseline Grip 32, L 40   Status Achieved     OT LONG TERM GOAL #3   Title R prehension strength increase with 2-3 lbs to use remote, open package without increase symptoms    Baseline see flowsheet    Status Achieved     OT LONG TERM GOAL #4   Title Function improve on PRWHE with score less than 10/50   Baseline  improve from 21/50 to 0/50    Status Achieved               Plan - 05/06/17 0945    Clinical Impression Statement Pt made great progress in wrist ROM , pain ,  functional use , grip and prehension - pt to work on flexion and sup - and will check on her in 2 wks - upgraded her putty -    Rehab Potential Good   OT Frequency Biweekly   OT Treatment/Interventions Self-care/ADL training;Fluidtherapy;Splinting;Patient/family education;Therapeutic exercises;Ultrasound;Passive range of motion;Parrafin;Iontophoresis;Manual Therapy   Plan assess progress and possible discharge   OT Home Exercise Plan See pt instruction   Consulted and Agree with Plan of Care Patient      Patient will benefit from skilled therapeutic intervention in order to improve the following deficits and impairments:  Decreased range of motion, Impaired flexibility, Increased edema, Decreased knowledge of use of DME, Decreased strength, Impaired UE functional use, Pain  Visit Diagnosis: Pain in right hand  Pain in right wrist  Stiffness of right wrist, not elsewhere classified  Muscle weakness (generalized)    Problem List Patient Active Problem List   Diagnosis Date Noted  . Wrist fracture 02/08/2017  . Atrial fibrillation ([place]) 02/04/2017  . Osteoporosis 07/14/2015  . 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 Breast cancer ([place]) 07/14/2015  . Allergic rhinitis 07/14/2015  . Hypertension 07/14/2015  . Depression 07/14/2015    Rosalyn Gess OTR/L,CLT 05/06/2017, 10:22 AM  [facility] [address], [place], [address] Phone: [phone]   Fax:  [phone]  Name: DAMISHA WOLFF MRN: [id number] Date of Birth: [birth year]

## 2017-05-09 ENCOUNTER — Encounter: Payer: Medicare Other | Admitting: Occupational Therapy

## 2017-05-10 DIAGNOSIS — M65321 Trigger finger, right index finger: Secondary | ICD-10-CM | POA: Diagnosis not present

## 2017-05-10 DIAGNOSIS — S52571D Other intraarticular fracture of lower end of right radius, subsequent encounter for closed fracture with routine healing: Secondary | ICD-10-CM | POA: Diagnosis not present

## 2017-05-14 ENCOUNTER — Encounter: Payer: Medicare Other | Admitting: Occupational Therapy

## 2017-05-16 ENCOUNTER — Encounter: Payer: Medicare Other | Admitting: Occupational Therapy

## 2017-05-21 ENCOUNTER — Ambulatory Visit: Payer: Medicare Other | Attending: Orthopedic Surgery | Admitting: Occupational Therapy

## 2017-05-21 DIAGNOSIS — M25631 Stiffness of right wrist, not elsewhere classified: Secondary | ICD-10-CM | POA: Insufficient documentation

## 2017-05-21 DIAGNOSIS — M6281 Muscle weakness (generalized): Secondary | ICD-10-CM | POA: Insufficient documentation

## 2017-05-21 DIAGNOSIS — M79641 Pain in right hand: Secondary | ICD-10-CM | POA: Insufficient documentation

## 2017-05-21 DIAGNOSIS — M25531 Pain in right wrist: Secondary | ICD-10-CM

## 2017-05-21 NOTE — Patient Instructions (Signed)
   review with pt to cont with HEP for 1 x day for 2 wks and then every other day for 2 wks

## 2017-05-21 NOTE — Therapy (Signed)
[facility] [address] [place], [address] Phone: [phone]   Fax:  [phone]  Occupational Therapy Treatment/discharge  Patient Details  Name: Melanie Wallace MRN: [id number] Date of Birth: Dec 31, 1945 Referring Provider: Arvella Nigh  Encounter Date: 05/21/2017      OT End of Session - 05/21/17 1026    Visit Number 7   Number of Visits 7   Date for OT Re-Evaluation 05/21/17   OT Start Time 0816   OT Stop Time 0840   OT Time Calculation (min) 24 min   Activity Tolerance Patient tolerated treatment well   Behavior During Therapy [facility] for tasks assessed/performed      Past Medical History:  Diagnosis Date  . Allergic rhinitis   . Atrial fibrillation ([place])   . Breast CA ([place])   . Depression   . Hypertension   . Osteoporosis   . 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 BREAST SURGERY  03/2015  . NASAL SEPTUM SURGERY    . TUBAL LIGATION      There were no vitals filed for this visit.      Subjective Assessment - 05/21/17 1023    Subjective  Doing so much better -using my hand normally - no pain - can cook , drive , walk dog, clean the house, groceries, make the bed with fitting sheet    Patient Stated Goals Want to get the use of my R hand , no pain and good strength - open jars, lifting pots and pans, walk dog, remote control, carry gallon , bathing    Currently in Pain? No/denies            [facility] OT Assessment - 05/21/17 0001      AROM   Right Forearm Supination 85 Degrees   Right Wrist Extension 70 Degrees   Right Wrist Flexion 75 Degrees   Right Wrist Radial Deviation 30 Degrees   Right Wrist Ulnar Deviation 26 Degrees     Strength   Right Hand Grip (lbs) 33   Right Hand Lateral Pinch 13 lbs   Right Hand 3 Point Pinch 10 lbs   Left Hand Grip (lbs) 38   Left Hand Lateral Pinch 14 lbs   Left Hand 3 Point Pinch 11 lbs        Measurements taken for ROM and grip/prehension - see flowsheet  review with pt to cont with HEP for 1 x day for 2 wks and then every other day for 2 wks  Review with pt functional use of R dominant hand - PRWHE done - pain and function 0/50                    OT Education - 05/21/17 1026    Education provided Yes   Education Details discharge instructions   Person(s) Educated Patient   Methods Explanation;Demonstration;Tactile cues;Verbal cues;Handout   Comprehension Verbal cues required;Returned demonstration;Verbalized understanding          OT Short Term Goals - 05/21/17 1028      OT SHORT TERM GOAL #1   Title Pain improve to less than 3/10 at the worse at the end of day    Status Achieved     OT SHORT TERM GOAL #2   Title R wrist AROM improve with  at least 10 degrees in flexion, ext, supination to do laundry , clean, cook    Status Achieved  OT Long Term Goals - 05/21/17 1029      OT LONG TERM GOAL #1   Title R hand Gripping and opposition improve WNL and no pain to use remote , lift pots, fasten bra   Status Achieved     OT LONG TERM GOAL #2   Title R grip strength improve with more than 7 lbs to lift pots, change gears, carry groceries, feed dog    Baseline Grip 33 R , L 38 lbs    Status Achieved     OT LONG TERM GOAL #3   Title R prehension strength increase with 2-3 lbs to use remote, open package without increase symptoms    Baseline see flowsheet    Status Achieved     OT LONG TERM GOAL #4   Title Function improve on PRWHE with score less than 10/50   Baseline improve from 21/50 to 0/50    Status Achieved               Plan - 05/21/17 1027    Clinical Impression Statement Pt made great progress in ROM , strength, pain and functional use - pt met all goals and using R dominant hand in most all tasks - pt to cont with HEP for another 2 wks and then stop gradually and use hand normally    OT Treatment/Interventions  Self-care/ADL training;Fluidtherapy;Splinting;Patient/family education;Therapeutic exercises;Ultrasound;Passive range of motion;Parrafin;Iontophoresis;Manual Therapy   Plan discharge with HEP   OT Home Exercise Plan See pt instruction   Consulted and Agree with Plan of Care Patient      Patient will benefit from skilled therapeutic intervention in order to improve the following deficits and impairments:     Visit Diagnosis: Pain in right hand - Plan: Ot plan of care cert/re-cert  Pain in right wrist - Plan: Ot plan of care cert/re-cert  Stiffness of right wrist, not elsewhere classified - Plan: Ot plan of care cert/re-cert  Muscle weakness (generalized) - Plan: Ot plan of care cert/re-cert      G-Codes - 54/98/26 1029    Functional Assessment Tool Used (Outpatient only) PRWHE , ROM , grip and prehensions - clincial judgement    Functional Limitation Self care   Self Care Current Status (E1583) 0 percent impaired, limited or restricted   Self Care Goal Status (E9407) At least 1 percent but less than 20 percent impaired, limited or restricted   Self Care Discharge Status [phone]) 0 percent impaired, limited or restricted      Problem List Patient Active Problem List   Diagnosis Date Noted  . Wrist fracture 02/08/2017  . Atrial fibrillation ([place]) 02/04/2017  . Osteoporosis 07/14/2015  . 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 Breast cancer ([place]) 07/14/2015  . Allergic rhinitis 07/14/2015  . Hypertension 07/14/2015  . Depression 07/14/2015    Rosalyn Gess OTR/L,CLT 05/21/2017, 10:32 AM  [place] [address], [place], [address] Phone: [phone]   Fax:  [phone]  Name: Melanie Wallace MRN: [id number] Date of Birth: [birth year]

## 2017-06-25 ENCOUNTER — Telehealth: Payer: Self-pay | Admitting: Unknown Physician Specialty

## 2017-06-25 DIAGNOSIS — E2839 Other primary ovarian failure: Secondary | ICD-10-CM

## 2017-06-25 NOTE — Telephone Encounter (Signed)
Crystal with [facility] called and stated that the pt needed to have an order placed to have a dexa scan.

## 2017-06-25 NOTE — Telephone Encounter (Signed)
Routing to provider  

## 2017-06-26 NOTE — Telephone Encounter (Signed)
Called to let patient know about bone density order. Patient states that she has everything like this done through [facility] and she has not received anything from them. She states that she always gets a letter when it is time for anything. Spoke with Kristin Bruins, our referral coordinator about this. She looked it up in [facility] and there system says that the patient is not due until 11/11/17. She said that she went ahead and put the order in though through [facility].

## 2017-08-06 ENCOUNTER — Ambulatory Visit (INDEPENDENT_AMBULATORY_CARE_PROVIDER_SITE_OTHER): Payer: Medicare Other | Admitting: Unknown Physician Specialty

## 2017-08-06 ENCOUNTER — Encounter: Payer: Self-pay | Admitting: Unknown Physician Specialty

## 2017-08-06 DIAGNOSIS — I10 Essential (primary) hypertension: Secondary | ICD-10-CM

## 2017-08-06 DIAGNOSIS — F325 Major depressive disorder, single episode, in full remission: Secondary | ICD-10-CM

## 2017-08-06 DIAGNOSIS — C50919 Malignant neoplasm of unspecified site of unspecified female breast: Secondary | ICD-10-CM

## 2017-08-06 DIAGNOSIS — I4891 Unspecified atrial fibrillation: Secondary | ICD-10-CM | POA: Diagnosis not present

## 2017-08-06 MED ORDER — LOSARTAN POTASSIUM-HCTZ 50-12.5 MG PO TABS: 1.0000 | ORAL_TABLET | Freq: Every day | ORAL | 2 refills | Status: DC

## 2017-08-06 MED ORDER — CITALOPRAM HYDROBROMIDE 40 MG PO TABS: 40.0000 mg | ORAL_TABLET | Freq: Every day | ORAL | 2 refills | Status: DC

## 2017-08-06 NOTE — Assessment & Plan Note (Addendum)
Stable, continue present medications. ASCVD risk score is 9%.  She will talk to Dr. Sabra Heck about whether a statin is prudent

## 2017-08-06 NOTE — Assessment & Plan Note (Signed)
Needs left breast mammogram

## 2017-08-06 NOTE — Progress Notes (Signed)
BP 116/66   Pulse 73   Temp (!) 97.5 F (36.4 C)   Ht 5' 6.8" (1.697 m)   Wt 158 lb 3.2 oz (71.8 kg)   LMP 01/03/1999 (Approximate)   SpO2 97%   BMI 24.93 kg/m    Subjective:    Patient ID: Melanie Wallace, female    DOB: [birth year], 71 y.o.   MRN: [id number]  HPI: Melanie Wallace is a 71 y.o. female  Chief Complaint  Patient presents with  . Anxiety  . Hypertension   Hypertension Using medications without difficulty Average home BPs   No problems or lightheadedness No chest pain with exertion or shortness of breath No Edema  The 10-year ASCVD risk score Mikey Bussing DC Jr., et al., 2013) is: 9%   Values used to calculate the score:     Age: 57 years     Sex: Female     Is Non-Hispanic African American: No     Diabetic: No     Tobacco smoker: No     Systolic Blood Pressure: 347 mmHg     Is BP treated: Yes     HDL Cholesterol: 58 mg/dL     Total Cholesterol: 180 mg/dL  Anxiety Taking Citalopran 40 mg.  Doing well.   Depression screen [facility] 2/9 08/06/2017 02/04/2017 07/31/2016 01/31/2016 07/18/2015  Decreased Interest 0 0 0 0 0  Down, Depressed, Hopeless 0 0 0 0 0  PHQ - 2 Score 0 0 0 0 0  Altered sleeping 0 0 - - -  Tired, decreased energy 0 0 - - -  Change in appetite 0 0 - - -  Feeling bad or failure about yourself  0 0 - - -  Trouble concentrating 0 0 - - -  Moving slowly or fidgety/restless 0 0 - - -  Suicidal thoughts 0 0 - - -  PHQ-9 Score 0 0 - - -   Mammogram Hx of breast cancer.  Needs diagnostic mammogram of left breast   Relevant past medical, surgical, family and social history reviewed and updated as indicated. Interim medical history since our last visit reviewed. Allergies and medications reviewed and updated.  Review of Systems  Per HPI unless specifically indicated above     Objective:    BP 116/66   Pulse 73   Temp (!) 97.5 F (36.4 C)   Ht 5' 6.8" (1.697 m)   Wt 158 lb 3.2 oz (71.8 kg)   LMP 01/03/1999 (Approximate)   SpO2 97%    BMI 24.93 kg/m   Wt Readings from Last 3 Encounters:  08/06/17 158 lb 3.2 oz (71.8 kg)  02/07/17 154 lb (69.9 kg)  02/04/17 154 lb 1.6 oz (69.9 kg)    Physical Exam  Constitutional: She is oriented to person, place, and time. She appears well-developed and well-nourished. No distress.  HENT:  Head: Normocephalic and atraumatic.  Eyes: Conjunctivae and lids are normal. Right eye exhibits no discharge. Left eye exhibits no discharge. No scleral icterus.  Neck: Normal range of motion. Neck supple. No JVD present. Carotid bruit is not present.  Cardiovascular: Normal rate, regular rhythm and normal heart sounds.   Pulmonary/Chest: Effort normal and breath sounds normal.  Abdominal: Normal appearance. There is no splenomegaly or hepatomegaly.  Musculoskeletal: Normal range of motion.  Neurological: She is alert and oriented to person, place, and time.  Skin: Skin is warm, dry and intact. No rash noted. No pallor.  Psychiatric: She has a normal mood and affect.  Her behavior is normal. Judgment and thought content normal.    Results for orders placed or performed in visit on 02/04/17  Lipid Panel w/o Chol/HDL Ratio  Result Value Ref Range   Cholesterol, Total 180 100 - 199 mg/dL   Triglycerides 133 0 - 149 mg/dL   HDL 58 >39 mg/dL   VLDL Cholesterol Cal 27 5 - 40 mg/dL   LDL Calculated 95 0 - 99 mg/dL      Assessment & Plan:   Problem List Items Addressed This Visit      Unprioritized   Atrial fibrillation ([place])    Through Dr. Sabra Heck.  Doing well      Relevant Medications   losartan-hydrochlorothiazide (HYZAAR) 50-12.5 MG tablet   Breast cancer ([facility])    Needs left breast mammogram      Relevant Orders   MM Digital Diagnostic Unilat L   Depression    Stable, continue present medications.        Relevant Medications   citalopram (CELEXA) 40 MG tablet   Hypertension    Stable, continue present medications. ASCVD risk score is 9%.  She will talk to Dr. Sabra Heck about  whether a statin is prudent       Relevant Medications   losartan-hydrochlorothiazide (HYZAAR) 50-12.5 MG tablet       Follow up plan: Return in about 6 months (around 02/06/2018).

## 2017-08-06 NOTE — Assessment & Plan Note (Signed)
Stable, continue present medications.   

## 2017-08-06 NOTE — Assessment & Plan Note (Signed)
Through Dr. Sabra Heck.  Doing well

## 2017-08-15 DIAGNOSIS — I480 Paroxysmal atrial fibrillation: Secondary | ICD-10-CM | POA: Diagnosis not present

## 2017-08-15 DIAGNOSIS — I10 Essential (primary) hypertension: Secondary | ICD-10-CM | POA: Diagnosis not present

## 2017-09-27 DIAGNOSIS — Z9882 Breast implant status: Secondary | ICD-10-CM | POA: Diagnosis not present

## 2017-09-27 DIAGNOSIS — Z853 Personal history of malignant neoplasm of breast: Secondary | ICD-10-CM | POA: Diagnosis not present

## 2017-09-27 DIAGNOSIS — Z08 Encounter for follow-up examination after completed treatment for malignant neoplasm: Secondary | ICD-10-CM | POA: Diagnosis not present

## 2017-10-29 DIAGNOSIS — M810 Age-related osteoporosis without current pathological fracture: Secondary | ICD-10-CM | POA: Diagnosis not present

## 2017-10-29 DIAGNOSIS — M8589 Other specified disorders of bone density and structure, multiple sites: Secondary | ICD-10-CM | POA: Diagnosis not present

## 2017-10-29 DIAGNOSIS — Z7983 Long term (current) use of bisphosphonates: Secondary | ICD-10-CM | POA: Diagnosis not present

## 2017-10-29 DIAGNOSIS — M8588 Other specified disorders of bone density and structure, other site: Secondary | ICD-10-CM | POA: Diagnosis not present

## 2017-10-29 DIAGNOSIS — Z780 Asymptomatic menopausal state: Secondary | ICD-10-CM | POA: Diagnosis not present

## 2017-10-29 DIAGNOSIS — M85852 Other specified disorders of bone density and structure, left thigh: Secondary | ICD-10-CM | POA: Diagnosis not present

## 2018-02-05 ENCOUNTER — Ambulatory Visit (INDEPENDENT_AMBULATORY_CARE_PROVIDER_SITE_OTHER): Payer: Medicare Other

## 2018-02-05 VITALS — BP 122/60 | HR 62 | Temp 98.1°F | Resp 16 | Ht 67.0 in | Wt 160.0 lb

## 2018-02-05 DIAGNOSIS — Z0000 Encounter for general adult medical examination without abnormal findings: Secondary | ICD-10-CM | POA: Diagnosis not present

## 2018-02-05 DIAGNOSIS — Z23 Encounter for immunization: Secondary | ICD-10-CM | POA: Diagnosis not present

## 2018-02-05 NOTE — Patient Instructions (Addendum)
Melanie Wallace , Thank you for taking time to come for your Medicare Wellness Visit. I appreciate your ongoing commitment to your health goals. Please review the following plan we discussed and let me know if I can assist you in the future.   Screening recommendations/referrals: Colonoscopy: completed 08/05/2007 Mammogram: completed 09/27/2017  Bone Density: completed 11/14/2017   Vaccinations: Influenza vaccine: done today  Pneumococcal vaccine: up to date Tdap vaccine: up to date Shingles vaccine: zostavax completed, shingrix eligible- please call your insurance company for coverage   Advanced directives: Advance directive discussed with you today. I have provided a copy for you to complete at home and have notarized. Once this is complete please bring a copy in to our office so we can scan it into your chart.   Next appointment: Follow up on 02/10/2018 at 10:00am with Regino Schultze. Follow up In one year for your annual wellness exam.    Preventive Care 65 Years and Older, Female Preventive care refers to lifestyle choices and visits with your health care provider that can promote health and wellness. What does preventive care include?  A yearly physical exam. This is also called an annual well check.  Dental exams once or twice a year.  Routine eye exams. Ask your health care provider how often you should have your eyes checked.  Personal lifestyle choices, including:  Daily care of your teeth and gums.  Regular physical activity.  Eating a healthy diet.  Avoiding tobacco and drug use.  Limiting alcohol use.  Practicing safe sex.  Taking low-dose aspirin every day.  Taking vitamin and mineral supplements as recommended by your health care provider. What happens during an annual well check? The services and screenings done by your health care provider during your annual well check will depend on your age, overall health, lifestyle risk factors, and family history of  disease. Counseling  Your health care provider may ask you questions about your:  Alcohol use.  Tobacco use.  Drug use.  Emotional well-being.  Home and relationship well-being.  Sexual activity.  Eating habits.  History of falls.  Memory and ability to understand (cognition).  Work and work Statistician.  Reproductive health. Screening  You may have the following tests or measurements:  Height, weight, and BMI.  Blood pressure.  Lipid and cholesterol levels. These may be checked every 5 years, or more frequently if you are over 80 years old.  Skin check.  Lung cancer screening. You may have this screening every year starting at age 71 if you have a 30-pack-year history of smoking and currently smoke or have quit within the past 15 years.  Fecal occult blood test (FOBT) of the stool. You may have this test every year starting at age 71.  Flexible sigmoidoscopy or colonoscopy. You may have a sigmoidoscopy every 5 years or a colonoscopy every 10 years starting at age 71.  Hepatitis C blood test.  Hepatitis B blood test.  Sexually transmitted disease (STD) testing.  Diabetes screening. This is done by checking your blood sugar (glucose) after you have not eaten for a while (fasting). You may have this done every 1-3 years.  Bone density scan. This is done to screen for osteoporosis. You may have this done starting at age 71.  Mammogram. This may be done every 1-2 years. Talk to your health care provider about how often you should have regular mammograms. Talk with your health care provider about your test results, treatment options, and if necessary, the need  for more tests. Vaccines  Your health care provider may recommend certain vaccines, such as:  Influenza vaccine. This is recommended every year.  Tetanus, diphtheria, and acellular pertussis (Tdap, Td) vaccine. You may need a Td booster every 10 years.  Zoster vaccine. You may need this after age  60.  Pneumococcal 13-valent conjugate (PCV13) vaccine. One dose is recommended after age 25.  Pneumococcal polysaccharide (PPSV23) vaccine. One dose is recommended after age 71. Talk to your health care provider about which screenings and vaccines you need and how often you need them. This information is not intended to replace advice given to you by your health care provider. Make sure you discuss any questions you have with your health care provider. Document Released: 12/30/2015 Document Revised: 08/22/2016 Document Reviewed: 10/04/2015 Elsevier Interactive Patient Education  2017 [place] Prevention in the Home Falls can cause injuries. They can happen to people of all ages. There are many things you can do to make your home safe and to help prevent falls. What can I do on the outside of my home?  Regularly fix the edges of walkways and driveways and fix any cracks.  Remove anything that might make you trip as you walk through a door, such as a raised step or threshold.  Trim any bushes or trees on the path to your home.  Use bright outdoor lighting.  Clear any walking paths of anything that might make someone trip, such as rocks or tools.  Regularly check to see if handrails are loose or broken. Make sure that both sides of any steps have handrails.  Any raised decks and porches should have guardrails on the edges.  Have any leaves, snow, or ice cleared regularly.  Use sand or salt on walking paths during winter.  Clean up any spills in your garage right away. This includes oil or grease spills. What can I do in the bathroom?  Use night lights.  Install grab bars by the toilet and in the tub and shower. Do not use towel bars as grab bars.  Use non-skid mats or decals in the tub or shower.  If you need to sit down in the shower, use a plastic, non-slip stool.  Keep the floor dry. Clean up any water that spills on the floor as soon as it happens.  Remove  soap buildup in the tub or shower regularly.  Attach bath mats securely with double-sided non-slip rug tape.  Do not have throw rugs and other things on the floor that can make you trip. What can I do in the bedroom?  Use night lights.  Make sure that you have a light by your bed that is easy to reach.  Do not use any sheets or blankets that are too big for your bed. They should not hang down onto the floor.  Have a firm chair that has side arms. You can use this for support while you get dressed.  Do not have throw rugs and other things on the floor that can make you trip. What can I do in the kitchen?  Clean up any spills right away.  Avoid walking on wet floors.  Keep items that you use a lot in easy-to-reach places.  If you need to reach something above you, use a strong step stool that has a grab bar.  Keep electrical cords out of the way.  Do not use floor polish or wax that makes floors slippery. If you must use wax, use  non-skid floor wax.  Do not have throw rugs and other things on the floor that can make you trip. What can I do with my stairs?  Do not leave any items on the stairs.  Make sure that there are handrails on both sides of the stairs and use them. Fix handrails that are broken or loose. Make sure that handrails are as long as the stairways.  Check any carpeting to make sure that it is firmly attached to the stairs. Fix any carpet that is loose or worn.  Avoid having throw rugs at the top or bottom of the stairs. If you do have throw rugs, attach them to the floor with carpet tape.  Make sure that you have a light switch at the top of the stairs and the bottom of the stairs. If you do not have them, ask someone to add them for you. What else can I do to help prevent falls?  Wear shoes that:  Do not have high heels.  Have rubber bottoms.  Are comfortable and fit you well.  Are closed at the toe. Do not wear sandals.  If you use a  stepladder:  Make sure that it is fully opened. Do not climb a closed stepladder.  Make sure that both sides of the stepladder are locked into place.  Ask someone to hold it for you, if possible.  Clearly mark and make sure that you can see:  Any grab bars or handrails.  First and last steps.  Where the edge of each step is.  Use tools that help you move around (mobility aids) if they are needed. These include:  Canes.  Walkers.  Scooters.  Crutches.  Turn on the lights when you go into a dark area. Replace any light bulbs as soon as they burn out.  Set up your furniture so you have a clear path. Avoid moving your furniture around.  If any of your floors are uneven, fix them.  If there are any pets around you, be aware of where they are.  Review your medicines with your doctor. Some medicines can make you feel dizzy. This can increase your chance of falling. Ask your doctor what other things that you can do to help prevent falls. This information is not intended to replace advice given to you by your health care provider. Make sure you discuss any questions you have with your health care provider. Document Released: 09/29/2009 Document Revised: 05/10/2016 Document Reviewed: 01/07/2015 Elsevier Interactive Patient Education  2017 [employer].

## 2018-02-05 NOTE — Progress Notes (Signed)
Subjective:   Melanie Wallace is a 72 y.o. female who presents for Medicare Annual (Subsequent) preventive examination.  Review of Systems:   Cardiac Risk Factors include: advanced age (>30men, >51 women);hypertension     Objective:     Vitals: BP 122/60 (BP Location: Left Arm, Patient Position: Sitting)   Pulse 62   Temp 98.1 F (36.7 C) (Temporal)   Resp 16   Ht 5\' 7"  (1.702 m)   Wt 160 lb (72.6 kg)   LMP 01/03/1999 (Approximate)   BMI 25.06 kg/m   Body mass index is 25.06 kg/m.  Advanced Directives 02/05/2018 02/07/2017 02/04/2017 01/31/2016 01/31/2016  Does Patient Have a Medical Advance Directive? No No No No No  Would patient like information on creating a medical advance directive? Yes (MAU/Ambulatory/Procedural Areas - Information given) No - Patient declined - Yes - Educational materials given -    Tobacco Social History   Tobacco Use  Smoking Status Former Smoker  Smokeless Tobacco Never Used  Tobacco Comment   quit in 95- smoked for 3 years total      Counseling given: Not Answered Comment: quit in 95- smoked for 3 years total    Clinical Intake:  Pre-visit preparation completed: Yes  Pain : No/denies pain     Nutritional Status: BMI 25 -29 Overweight Nutritional Risks: None Diabetes: No  How often do you need to have someone help you when you read instructions, pamphlets, or other written materials from your doctor or pharmacy?: 1 - Never What is the last grade level you completed in school?: high school  Interpreter Needed?: No  Information entered by :: Gorge Almanza,LPN   Past Medical History:  Diagnosis Date  . Allergic rhinitis   . Atrial fibrillation ([place])   . Breast CA ([place])   . Depression   . Hypertension   . Osteoporosis   . 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 BREAST SURGERY  03/2015  . NASAL SEPTUM SURGERY    . TUBAL LIGATION     Family History  Problem Relation Age of Onset  .  Cancer Mother        breast and bone  . Hypertension Mother   . Cancer Father        prostate  . Hypertension Sister   . Hypertension Daughter   . Hypothyroidism Daughter   . Allergies Daughter   . Hyperlipidemia Maternal Grandmother   . Heart attack Maternal Grandmother   . Stroke Paternal Grandfather    Social History   Socioeconomic History  . Marital status: Married    Spouse name: None  . Number of children: None  . Years of education: 58  . Highest education level: High school graduate  Social Needs  . Financial resource strain: Not hard at all  . Food insecurity - worry: Never true  . Food insecurity - inability: Never true  . Transportation needs - medical: No  . Transportation needs - non-medical: No  Occupational History  . None  Tobacco Use  . Smoking status: Former Research scientist (life sciences)  . Smokeless tobacco: Never Used  . Tobacco comment: quit in 95- smoked for 3 years total   Substance and Sexual Activity  . Alcohol use: No    Alcohol/week: 0.0 oz  . Drug use: No  . Sexual activity: Yes  Other Topics Concern  . None  Social History Narrative  . None    Outpatient Encounter Medications as of 02/05/2018  Medication Sig  . apixaban (ELIQUIS) 5 MG TABS tablet Take 5 mg by mouth 2 (two) times daily.  . calcium-vitamin D (OSCAL WITH D) 500-200 MG-UNIT per tablet Take 1 tablet by mouth.  . citalopram (CELEXA) 40 MG tablet Take 1 tablet (40 mg total) by mouth at bedtime.  Marland Kitchen losartan-hydrochlorothiazide (HYZAAR) 50-12.5 MG tablet Take 1 tablet by mouth daily.  . metoprolol succinate (TOPROL-XL) 25 MG 24 hr tablet Take 25 mg by mouth daily.   No facility-administered encounter medications on file as of 02/05/2018.     Activities of Daily Living In your present state of health, do you have any difficulty performing the following activities: 02/05/2018  Hearing? N  Vision? N  Difficulty concentrating or making decisions? N  Walking or climbing stairs? N  Dressing or  bathing? N  Doing errands, shopping? N  Preparing Food and eating ? N  Using the Toilet? N  In the past six months, have you accidently leaked urine? N  Do you have problems with loss of bowel control? N  Managing your Medications? N  Managing your Finances? N  Housekeeping or managing your Housekeeping? N  Some recent data might be hidden    Patient Care Team: Kathrine Haddock, NP as PCP - General (Nurse Practitioner) Levonne Spiller, MD as Referring Physician (Cardiology)    Assessment:   This is a routine wellness examination for Newton Hamilton.  Exercise Activities and Dietary recommendations Current Exercise Habits: The patient does not participate in regular exercise at present, Exercise limited by: None identified  Goals    . DIET - INCREASE WATER INTAKE     Recommend drinking at least 6-8 glasses of water a day        Fall Risk Fall Risk  02/05/2018 08/06/2017 02/04/2017 07/31/2016 01/31/2016  Falls in the past year? No Yes No No No  Number falls in past yr: - 1 - - -   Is the patient's home free of loose throw rugs in walkways, pet beds, electrical cords, etc?   yes      Grab bars in the bathroom? yes      Handrails on the stairs?   no      Adequate lighting?   yes  Timed Get Up and Go performed: Completed in 8 seconds with no use of assistive devices, steady gait. No intervention needed at this time.   Depression Screen PHQ 2/9 Scores 02/05/2018 08/06/2017 02/04/2017 07/31/2016  PHQ - 2 Score 0 0 0 0  PHQ- 9 Score - 0 0 -     Cognitive Function     6CIT Screen 02/05/2018  What Year? 0 points  What month? 0 points  What time? 0 points  Count back from 20 0 points  Months in reverse 0 points  Repeat phrase 0 points  Total Score 0    Immunization History  Administered Date(s) Administered  . Influenza, High Dose Seasonal PF 02/05/2018  . Influenza,inj,Quad PF,6+ Mos 10/10/2016  . Pneumococcal Conjugate-13 07/05/2014  . Pneumococcal Polysaccharide-23 12/30/2012,  12/20/2013  . Td 12/17/2004  . Tdap 04/15/2016, 07/31/2016  . Zoster 11/15/2011, 12/25/2011    Qualifies for Shingles Vaccine?yes, discussed shingrix vaccine.  Screening Tests Health Maintenance  Topic Date Due  . COLONOSCOPY  09/18/1997  . MAMMOGRAM  09/28/2019  . TETANUS/TDAP  07/31/2026  . INFLUENZA VACCINE  Completed  . DEXA SCAN  Completed  . Hepatitis C Screening  Completed  . PNA vac Low Risk Adult  Completed  Cancer Screenings: Lung: Low Dose CT Chest recommended if Age 19-80 years, 30 pack-year currently smoking OR have quit w/in 15years. Patient does not qualify. Breast:  Up to date on Mammogram? No  - states done in October through [facility]. - report in care everywhere Up to date of Bone Density/Dexa? Yes Colorectal: declined - cologuard information given to patient. She will call her insurance about coverage.   Additional Screenings:  Hepatitis B/HIV/Syphillis: not indicated Hepatitis C Screening: completed 01/31/2016     Plan:    I have personally reviewed and addressed the Medicare Annual Wellness questionnaire and have noted the following in the patient's chart:  A. Medical and social history B. Use of alcohol, tobacco or illicit drugs  C. Current medications and supplements D. Functional ability and status E.  Nutritional status F.  Physical activity G. Advance directives H. List of other physicians I.  Hospitalizations, surgeries, and ER visits in previous 12 months J.  [place] such as hearing and vision if needed, cognitive and depression L. Referrals and appointments   In addition, I have reviewed and discussed with patient certain preventive protocols, quality metrics, and best practice recommendations. A written personalized care plan for preventive services as well as general preventive health recommendations were provided to patient.   Signed,  Tyler Aas, LPN Nurse Health Advisor   Nurse Notes:none

## 2018-02-07 ENCOUNTER — Telehealth: Payer: Self-pay | Admitting: Unknown Physician Specialty

## 2018-02-07 NOTE — Telephone Encounter (Signed)
Copied from [place] [phone]. Topic: Complaint - Provider (sensitive) >> Feb 06, 2018  8:18 AM Aurelio Brash B wrote: Date of Incident: 02/05/18 [facility] Details of complaint: PT states the paper she received after her visit yesterday had a note that  recommend 6-8 glasses of water  and she says they did not discuss water intake and she drinks water all day long How would the patient like to see it resolved? Wants permanent record changed   On a scale of 1-10, how was your experience?1 What would it take to bring it to a 10? Apology Pt is stating she is coming to office today after her mail delivery Route to Engineer, building services.  >> Feb 06, 2018  3:39 PM Lolita Rieger, [place] wrote: Pt called back upset that AVS mis stated that she does not drink water and would like for it to be corrected >> Feb 07, 2018  3:26 PM Barth Kirks B wrote: Routing to [facility], LPN to contact patient and correct office note as requested by patient.

## 2018-02-07 NOTE — Telephone Encounter (Signed)
Spoke with patient and advised that her medical record has been revised. Apologized for the patient's unhappiness. Patient will pick up copy of revised AVS when she comes in for appt with Kathrine Haddock on 02/10/18.

## 2018-02-07 NOTE — Telephone Encounter (Signed)
Called and apologized to patient regarding recommended water intake, informed patient it was just a recommendation and was recommended to all patients. Patient states she would like this taken out of her chart as she states she drinks enough water.   Spoke with practice administrator Barth Kirks to get this addended.

## 2018-02-10 ENCOUNTER — Ambulatory Visit (INDEPENDENT_AMBULATORY_CARE_PROVIDER_SITE_OTHER): Payer: Medicare Other | Admitting: Unknown Physician Specialty

## 2018-02-10 ENCOUNTER — Encounter: Payer: Self-pay | Admitting: Unknown Physician Specialty

## 2018-02-10 DIAGNOSIS — F325 Major depressive disorder, single episode, in full remission: Secondary | ICD-10-CM

## 2018-02-10 DIAGNOSIS — I10 Essential (primary) hypertension: Secondary | ICD-10-CM | POA: Diagnosis not present

## 2018-02-10 MED ORDER — CITALOPRAM HYDROBROMIDE 40 MG PO TABS: 40.0000 mg | ORAL_TABLET | Freq: Every day | ORAL | 2 refills | Status: DC

## 2018-02-10 MED ORDER — LOSARTAN POTASSIUM-HCTZ 50-12.5 MG PO TABS: 1.0000 | ORAL_TABLET | Freq: Every day | ORAL | 2 refills | Status: DC

## 2018-02-10 NOTE — Assessment & Plan Note (Signed)
In remission on Citalopram

## 2018-02-10 NOTE — Assessment & Plan Note (Signed)
Stable, continue present medications.   

## 2018-02-10 NOTE — Progress Notes (Signed)
BP 123/78   Pulse 66   Temp 98.2 F (36.8 C) (Oral)   Ht 5\' 7"  (1.702 m)   Wt 158 lb 6.4 oz (71.8 kg)   LMP 01/03/1999 (Approximate)   SpO2 98%   BMI 24.81 kg/m    Subjective:    Patient ID: Melanie Wallace, female    DOB: 18-Oct-1946, 72 y.o.   MRN: [id number]  HPI: Melanie Wallace is a 72 y.o. female  Chief Complaint  Patient presents with  . Annual Exam    pt had wellness exam 02/05/18 with [facility]   Depression Denies depression at this time.  She does want to continue Citalopram Depression screen [facility] 2/9 02/10/2018 02/05/2018 08/06/2017 02/04/2017 07/31/2016  Decreased Interest 0 0 0 0 0  Down, Depressed, Hopeless 0 0 0 0 0  PHQ - 2 Score 0 0 0 0 0  Altered sleeping 0 - 0 0 -  Tired, decreased energy 0 - 0 0 -  Change in appetite 0 - 0 0 -  Feeling bad or failure about yourself  0 - 0 0 -  Trouble concentrating 0 - 0 0 -  Moving slowly or fidgety/restless 0 - 0 0 -  Suicidal thoughts 0 - 0 0 -  PHQ-9 Score 0 - 0 0 -    Hypertension Using medications without difficulty Average home BPs 118-124  No problems or lightheadedness No chest pain with exertion or shortness of breath No Edema  A-Fib Taking Elquis.  Sees Dr. Sabra Heck.  Pt states that Dr. Sabra Heck does not think statins are needed for risk reduction and uses a [employer] risk reduction score  The 10-year ASCVD risk score Mikey Bussing DC Jr., et al., 2013) is: 11.2%   Values used to calculate the score:     Age: 48 years     Sex: Female     Is Non-Hispanic African American: No     Diabetic: No     Tobacco smoker: No     Systolic Blood Pressure: 500 mmHg     Is BP treated: Yes     HDL Cholesterol: 58 mg/dL     Total Cholesterol: 180 mg/dL   Relevant past medical, surgical, family and social history reviewed and updated as indicated. Interim medical history since our last visit reviewed. Allergies and medications reviewed and updated.  Review of Systems  Constitutional: Negative.   HENT: Negative.   Respiratory:  Negative.   Gastrointestinal: Negative.   Musculoskeletal: Negative.   Neurological: Negative.   Psychiatric/Behavioral: Negative.     Per HPI unless specifically indicated above     Objective:    BP 123/78   Pulse 66   Temp 98.2 F (36.8 C) (Oral)   Ht 5\' 7"  (1.702 m)   Wt 158 lb 6.4 oz (71.8 kg)   LMP 01/03/1999 (Approximate)   SpO2 98%   BMI 24.81 kg/m   Wt Readings from Last 3 Encounters:  02/10/18 158 lb 6.4 oz (71.8 kg)  02/05/18 160 lb (72.6 kg)  08/06/17 158 lb 3.2 oz (71.8 kg)    Physical Exam  Constitutional: She is oriented to person, place, and time. She appears well-developed and well-nourished. No distress.  HENT:  Head: Normocephalic and atraumatic.  Eyes: Conjunctivae and lids are normal. Right eye exhibits no discharge. Left eye exhibits no discharge. No scleral icterus.  Neck: Normal range of motion. Neck supple. No JVD present. Carotid bruit is not present.  Cardiovascular: Normal rate, regular rhythm and normal heart sounds.  Pulmonary/Chest: Effort normal and breath sounds normal.  Abdominal: Normal appearance. There is no splenomegaly or hepatomegaly.  Musculoskeletal: Normal range of motion.  Neurological: She is alert and oriented to person, place, and time.  Skin: Skin is warm, dry and intact. No rash noted. No pallor.  Psychiatric: She has a normal mood and affect. Her behavior is normal. Judgment and thought content normal.    Results for orders placed or performed in visit on 02/04/17  Lipid Panel w/o Chol/HDL Ratio  Result Value Ref Range   Cholesterol, Total 180 100 - 199 mg/dL   Triglycerides 133 0 - 149 mg/dL   HDL 58 >39 mg/dL   VLDL Cholesterol Cal 27 5 - 40 mg/dL   LDL Calculated 95 0 - 99 mg/dL      Assessment & Plan:   Problem List Items Addressed This Visit      Unprioritized   Depression    In remission on Citalopram      Relevant Medications   citalopram (CELEXA) 40 MG tablet   Hypertension    Stable, continue  present medications.        Relevant Medications   losartan-hydrochlorothiazide (HYZAAR) 50-12.5 MG tablet       Follow up plan: Return in about 6 months (around 08/10/2018).

## 2018-02-11 ENCOUNTER — Encounter: Payer: Self-pay | Admitting: Unknown Physician Specialty

## 2018-02-11 LAB — COMPREHENSIVE METABOLIC PANEL
ALT: 8 IU/L (ref 0–32)
AST: 13 IU/L (ref 0–40)
Albumin/Globulin Ratio: 2 (ref 1.2–2.2)
Albumin: 4.5 g/dL (ref 3.5–4.8)
Alkaline Phosphatase: 51 IU/L (ref 39–117)
BUN/Creatinine Ratio: 21 (ref 12–28)
BUN: 14 mg/dL (ref 8–27)
Bilirubin Total: 0.6 mg/dL (ref 0.0–1.2)
CO2: 24 mmol/L (ref 20–29)
Calcium: 9.7 mg/dL (ref 8.7–10.3)
Chloride: 100 mmol/L (ref 96–106)
Creatinine, Ser: 0.68 mg/dL (ref 0.57–1.00)
GFR calc Af Amer: 102 mL/min/{1.73_m2} (ref >59)
GFR calc non Af Amer: 89 mL/min/{1.73_m2} (ref >59)
Globulin, Total: 2.2 g/dL (ref 1.5–4.5)
Glucose: 87 mg/dL (ref 65–99)
Potassium: 4.8 mmol/L (ref 3.5–5.2)
Sodium: 141 mmol/L (ref 134–144)
Total Protein: 6.7 g/dL (ref 6.0–8.5)

## 2018-02-11 LAB — LIPID PANEL W/O CHOL/HDL RATIO
CHOLESTEROL TOTAL: 166 mg/dL (ref 100–199)
HDL: 59 mg/dL (ref >39)
LDL CALC: 90 mg/dL (ref 0–99)
Triglycerides: 85 mg/dL (ref 0–149)
VLDL CHOLESTEROL CAL: 17 mg/dL (ref 5–40)

## 2018-02-13 DIAGNOSIS — I10 Essential (primary) hypertension: Secondary | ICD-10-CM | POA: Diagnosis not present

## 2018-02-13 DIAGNOSIS — I480 Paroxysmal atrial fibrillation: Secondary | ICD-10-CM | POA: Diagnosis not present

## 2018-04-11 IMAGING — DX DG WRIST COMPLETE 3+V*R*
4 series · 4 of 4 positions shown · non-contrast
Comparison: None.

CLINICAL DATA: Recent fall with bruising and swelling to the right
forearm and wrist.

EXAM:
RIGHT WRIST - COMPLETE 3+ VIEW

[wrist ap (1 of 2)]
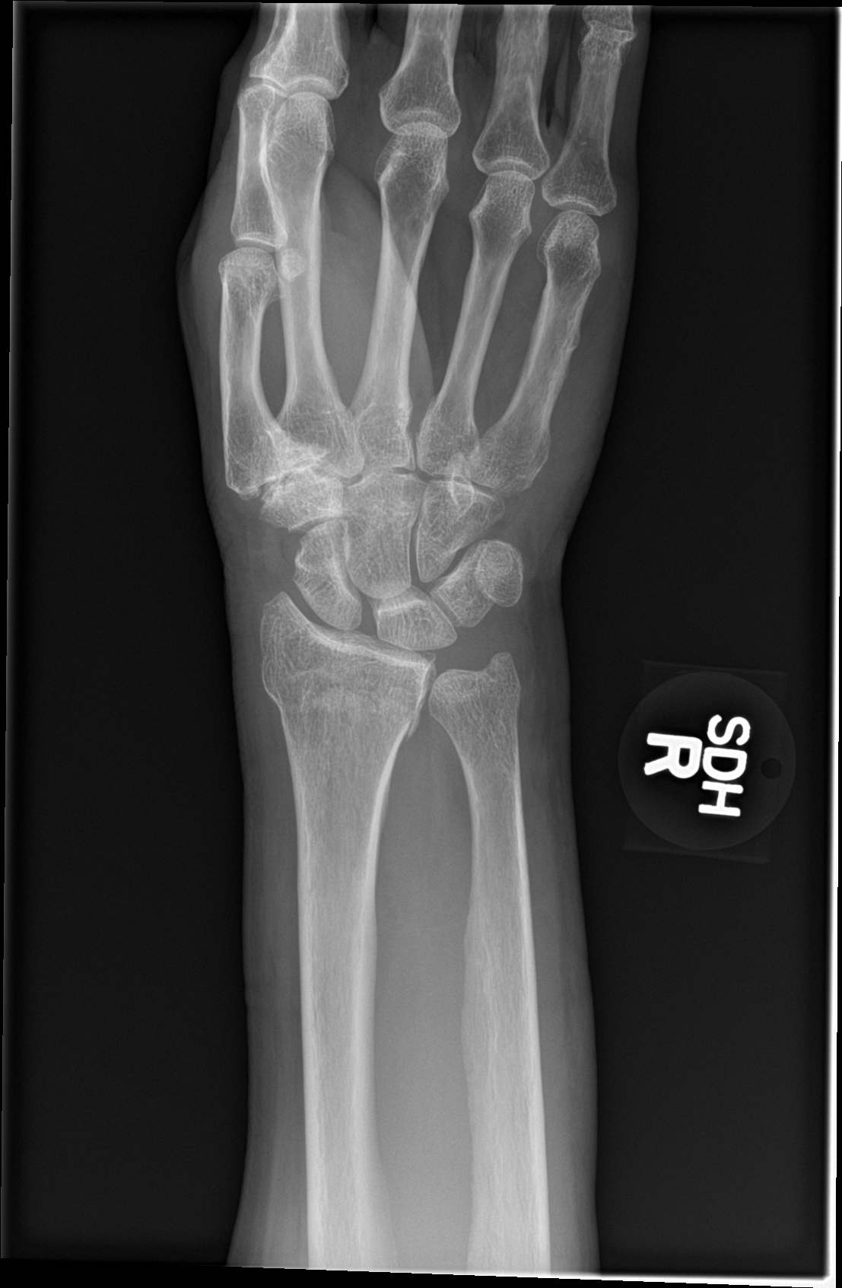

[wrist obl]
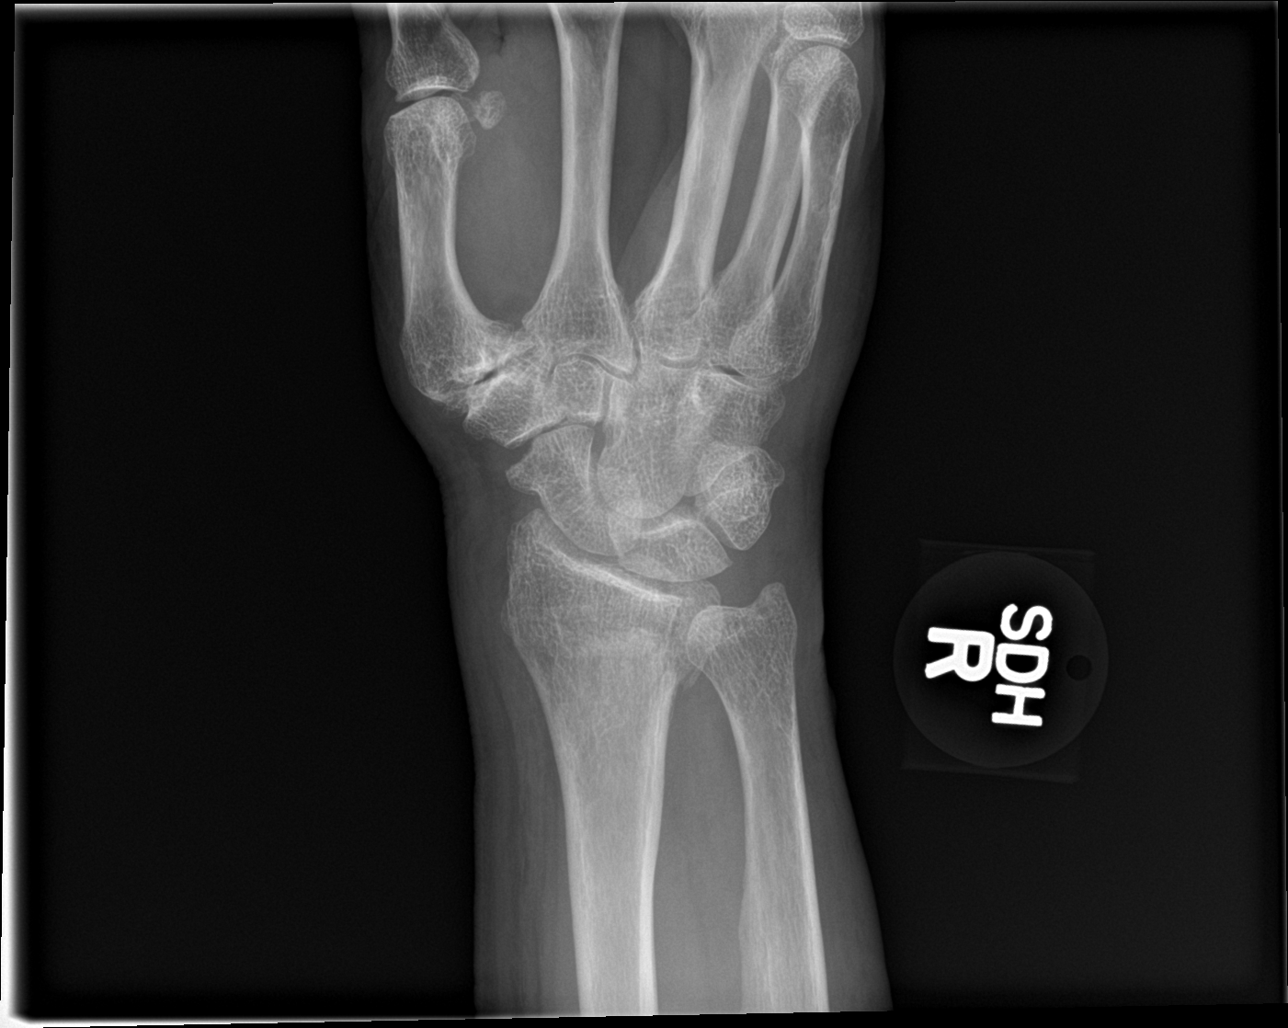

[wrist lat]
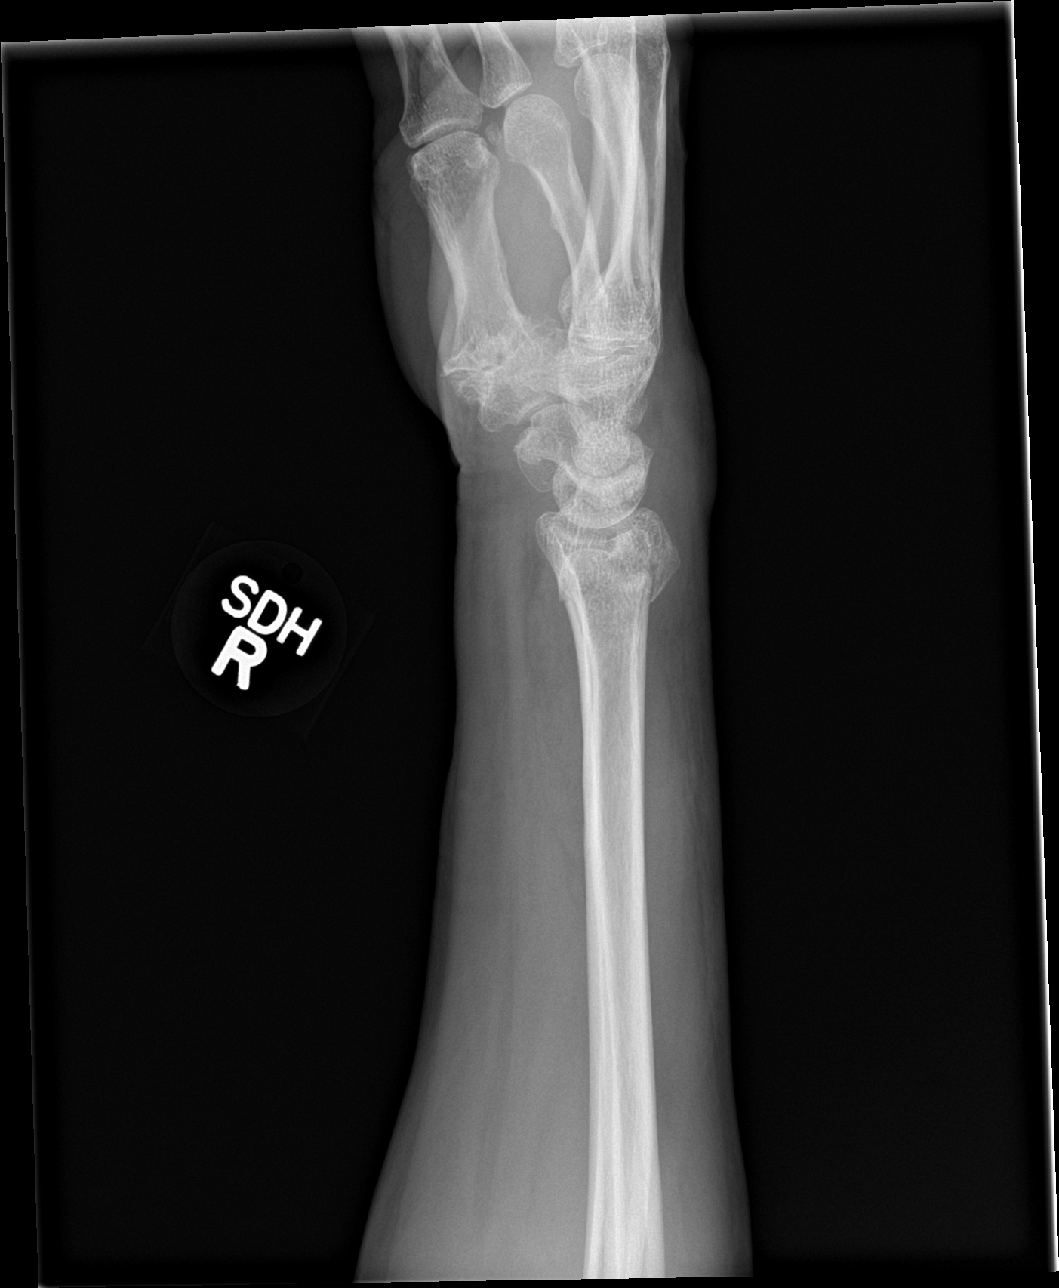

[wrist ap (2 of 2)]
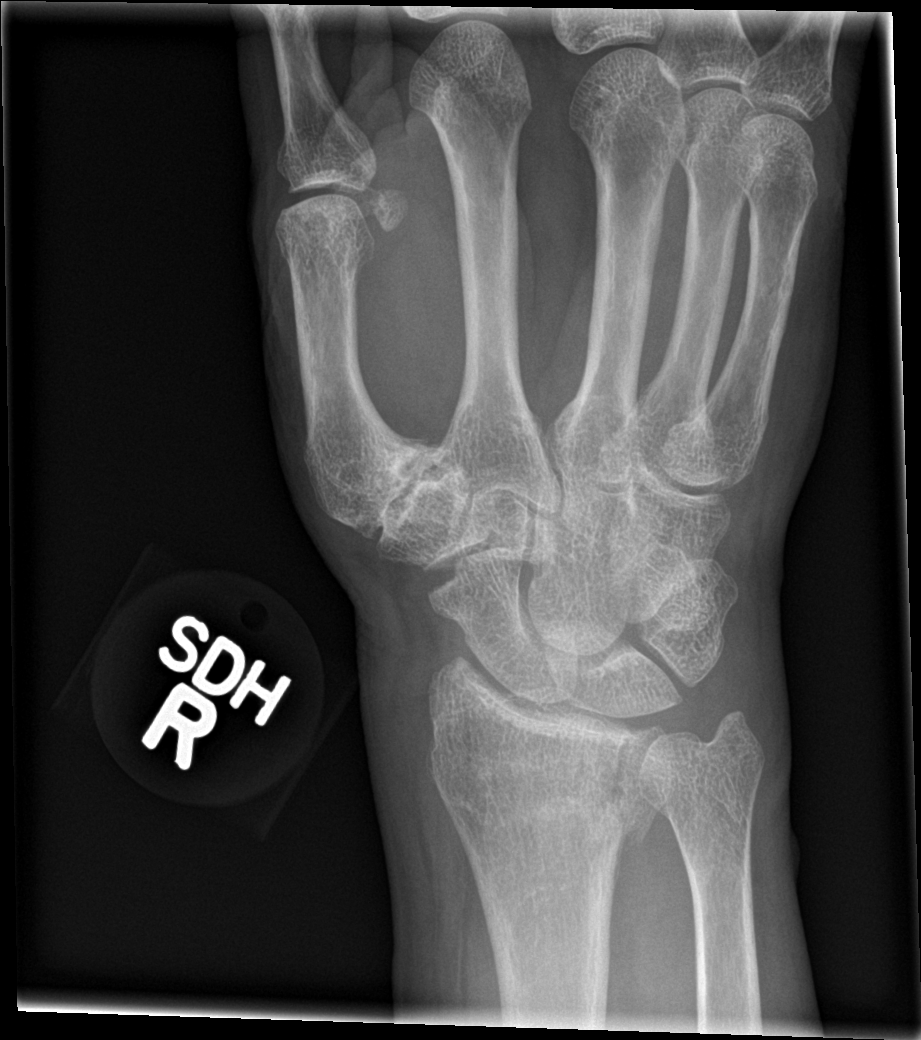

[4 of 4 positions shown; findings below may reference images not displayed]

FINDINGS: Mildly displaced fracture involving the distal radius. Fracture has
a longitudinal component along the ulnar aspect which extends to the
radiocarpal joint. Carpal bones are intact. Distal ulnar is intact.
Wrist is located. There is diffuse soft tissue swelling in the right
wrist. Severe osteoarthritic changes at the first carpometacarpal
joint.
IMPRESSION: Mildly displaced fracture of the distal radius with intra-articular
involvement.

Severe osteoarthritic changes at the first carpometacarpal joint.

## 2018-08-11 ENCOUNTER — Encounter: Payer: Self-pay | Admitting: Family Medicine

## 2018-08-11 ENCOUNTER — Ambulatory Visit: Payer: Medicare Other | Admitting: Unknown Physician Specialty

## 2018-08-11 ENCOUNTER — Ambulatory Visit (INDEPENDENT_AMBULATORY_CARE_PROVIDER_SITE_OTHER): Payer: Medicare Other | Admitting: Family Medicine

## 2018-08-11 ENCOUNTER — Other Ambulatory Visit: Payer: Self-pay

## 2018-08-11 VITALS — BP 101/64 | HR 64 | Temp 97.6°F | Ht 67.0 in | Wt 156.0 lb

## 2018-08-11 DIAGNOSIS — I10 Essential (primary) hypertension: Secondary | ICD-10-CM

## 2018-08-11 DIAGNOSIS — C50919 Malignant neoplasm of unspecified site of unspecified female breast: Secondary | ICD-10-CM | POA: Diagnosis not present

## 2018-08-11 DIAGNOSIS — F325 Major depressive disorder, single episode, in full remission: Secondary | ICD-10-CM | POA: Diagnosis not present

## 2018-08-11 DIAGNOSIS — I4891 Unspecified atrial fibrillation: Secondary | ICD-10-CM

## 2018-08-11 MED ORDER — CITALOPRAM HYDROBROMIDE 40 MG PO TABS: 40.0000 mg | ORAL_TABLET | Freq: Every day | ORAL | 1 refills | Status: DC

## 2018-08-11 MED ORDER — LOSARTAN POTASSIUM-HCTZ 50-12.5 MG PO TABS: 1.0000 | ORAL_TABLET | Freq: Every day | ORAL | 1 refills | Status: DC

## 2018-08-11 NOTE — Progress Notes (Signed)
BP 101/64   Pulse 64   Temp 97.6 F (36.4 C) (Oral)   Ht 5\' 7"  (1.702 m)   Wt 156 lb (70.8 kg)   LMP 01/03/1999 (Approximate)   SpO2 97%   BMI 24.43 kg/m    Subjective:    Patient ID: Melanie Wallace, female    DOB: 06-Oct-1946, 72 y.o.   MRN: [id number]  HPI: Melanie Wallace is a 72 y.o. female  Chief Complaint  Patient presents with  . Depression    6 m f/u refill celexa  . Hypertension    refill losartan   Here today for 6 month f/u. BPs under good control with losartan HCTZ and metoprolol. 118/70s stable without side effects. Compliant with regimen. Denies CP, SOB, syncope, HAs.   Moods stable on celexa. Denies SI/HI, sleep or appetite concerns.   Needs left breast mammogram, s/p right mastectomy. Gets mammograms at [facility].   Sees Cardiology for her atrial fibrillation. Under good control on eliquis and metoprolol. No bleeding or bruising issues, palpitations, SOB.   Past Medical History:  Diagnosis Date  . Allergic rhinitis   . Atrial fibrillation ([place])   . Breast CA ([place])   . Depression   . Hypertension   . Osteoporosis   . Vaginal cancer [facility])    Social History   Socioeconomic History  . Marital status: Married    Spouse name: Not on file  . Number of children: Not on file  . Years of education: 28  . Highest education level: High school graduate  Occupational History  . Not on file  Social Needs  . Financial resource strain: Not hard at all  . Food insecurity:    Worry: Never true    Inability: Never true  . Transportation needs:    Medical: No    Non-medical: No  Tobacco Use  . Smoking status: Former Research scientist (life sciences)  . Smokeless tobacco: Never Used  . Tobacco comment: quit in 95- smoked for 3 years total   Substance and Sexual Activity  . Alcohol use: No    Alcohol/week: 0.0 standard drinks  . Drug use: No  . Sexual activity: Yes  Lifestyle  . Physical activity:    Days per week: 0 days    Minutes per session: 0 min  . Stress: Not at all    Relationships  . Social connections:    Talks on phone: More than three times a week    Gets together: More than three times a week    Attends religious service: More than 4 times per year    Active member of club or organization: No    Attends meetings of clubs or organizations: Never    Relationship status: Married  . Intimate partner violence:    Fear of current or ex partner: No    Emotionally abused: No    Physically abused: No    Forced sexual activity: No  Other Topics Concern  . Not on file  Social History Narrative  . Not on file   Depression screen [facility] 2/9 08/11/2018 02/10/2018 02/05/2018  Decreased Interest 0 0 0  Down, Depressed, Hopeless 0 0 0  PHQ - 2 Score 0 0 0  Altered sleeping 0 0 -  Tired, decreased energy 0 0 -  Change in appetite 0 0 -  Feeling bad or failure about yourself  0 0 -  Trouble concentrating 0 0 -  Moving slowly or fidgety/restless 0 0 -  Suicidal thoughts 0 0 -  PHQ-9 Score 0 0 -    Relevant past medical, surgical, family and social history reviewed and updated as indicated. Interim medical history since our last visit reviewed. Allergies and medications reviewed and updated.  Review of Systems  Per HPI unless specifically indicated above     Objective:    BP 101/64   Pulse 64   Temp 97.6 F (36.4 C) (Oral)   Ht 5\' 7"  (1.702 m)   Wt 156 lb (70.8 kg)   LMP 01/03/1999 (Approximate)   SpO2 97%   BMI 24.43 kg/m   Wt Readings from Last 3 Encounters:  08/11/18 156 lb (70.8 kg)  02/10/18 158 lb 6.4 oz (71.8 kg)  02/05/18 160 lb (72.6 kg)    Physical Exam  Constitutional: She is oriented to person, place, and time. She appears well-developed and well-nourished. No distress.  HENT:  Head: Atraumatic.  Eyes: Conjunctivae and EOM are normal.  Neck: Normal range of motion. Neck supple.  Cardiovascular: Normal rate and normal heart sounds.  Pulmonary/Chest: Effort normal and breath sounds normal.  Musculoskeletal: Normal range of  motion.  Neurological: She is alert and oriented to person, place, and time.  Skin: Skin is warm and dry.  Psychiatric: She has a normal mood and affect. Her behavior is normal.  Nursing note and vitals reviewed.   Results for orders placed or performed in visit on 02/10/18  Lipid Panel w/o Chol/HDL Ratio  Result Value Ref Range   Cholesterol, Total 166 100 - 199 mg/dL   Triglycerides 85 0 - 149 mg/dL   HDL 59 >39 mg/dL   VLDL Cholesterol Cal 17 5 - 40 mg/dL   LDL Calculated 90 0 - 99 mg/dL  Comprehensive metabolic panel  Result Value Ref Range   Glucose 87 65 - 99 mg/dL   BUN 14 8 - 27 mg/dL   Creatinine, Ser 0.68 0.57 - 1.00 mg/dL   GFR calc non Af Amer 89 >59 mL/min/1.73   GFR calc Af Amer 102 >59 mL/min/1.73   BUN/Creatinine Ratio 21 12 - 28   Sodium 141 134 - 144 mmol/L   Potassium 4.8 3.5 - 5.2 mmol/L   Chloride 100 96 - 106 mmol/L   CO2 24 20 - 29 mmol/L   Calcium 9.7 8.7 - 10.3 mg/dL   Total Protein 6.7 6.0 - 8.5 g/dL   Albumin 4.5 3.5 - 4.8 g/dL   Globulin, Total 2.2 1.5 - 4.5 g/dL   Albumin/Globulin Ratio 2.0 1.2 - 2.2   Bilirubin Total 0.6 0.0 - 1.2 mg/dL   Alkaline Phosphatase 51 39 - 117 IU/L   AST 13 0 - 40 IU/L   ALT 8 0 - 32 IU/L      Assessment & Plan:   Problem List Items Addressed This Visit      Cardiovascular and Mediastinum   Hypertension - Primary    BPs stable and under good control. Continue current regimen.       Relevant Medications   metoprolol succinate (TOPROL-XL) 25 MG 24 hr tablet   apixaban (ELIQUIS) 5 MG TABS tablet   losartan-hydrochlorothiazide (HYZAAR) 50-12.5 MG tablet   Atrial fibrillation (HCC)    Stable, rate well controlled. Continue per Cardiology recommendations      Relevant Medications   metoprolol succinate (TOPROL-XL) 25 MG 24 hr tablet   apixaban (ELIQUIS) 5 MG TABS tablet   losartan-hydrochlorothiazide (HYZAAR) 50-12.5 MG tablet     Other   Breast cancer ([place])    Mammogram order placed for  monitoring        Relevant Orders   MM Digital Diagnostic Unilat L   Depression    Stable, moods under good control. Continue celexa      Relevant Medications   citalopram (CELEXA) 40 MG tablet       Follow up plan: Return for as scheduled for CPE.

## 2018-08-12 ENCOUNTER — Telehealth: Payer: Self-pay

## 2018-08-12 NOTE — Telephone Encounter (Signed)
Copied from [place] [id number]. Topic: Referral - Status >> Aug 11, 2018 11:48 AM Sheran Luz wrote: Reason for CRM: Pt called stating that Dr Orene Desanctis referred her to [facility] breast img for a mammogram. Pt states that she contacted [facility] breast img for scheduling and they advised her to get the referral re faxed to their office at [phone]  Pt also stated that she would not be able to have the mammogram until 10/12 as her insurance would not cover it until then. Pt asked to let her PCP know. >> Aug 12, 2018  2:41 PM Margot Ables wrote: Pt states this needs done promptly as she is a breast cancer survivor and only has one breast. Please notify pt when order is refaxed. Call back # [phone].   Orders printed and ready to fax. Need provider signature. Called and let patient know this and will get provider signature first thing in the morning and fax.

## 2018-08-13 NOTE — Telephone Encounter (Signed)
Order faxed per patient request

## 2018-08-14 DIAGNOSIS — I10 Essential (primary) hypertension: Secondary | ICD-10-CM | POA: Diagnosis not present

## 2018-08-14 DIAGNOSIS — I480 Paroxysmal atrial fibrillation: Secondary | ICD-10-CM | POA: Diagnosis not present

## 2018-08-14 NOTE — Assessment & Plan Note (Signed)
Mammogram order placed for monitoring

## 2018-08-14 NOTE — Assessment & Plan Note (Signed)
BPs stable and under good control. Continue current regimen 

## 2018-08-14 NOTE — Assessment & Plan Note (Signed)
Stable, rate well controlled. Continue per Cardiology recommendations 

## 2018-08-14 NOTE — Assessment & Plan Note (Signed)
Stable, moods under good control. Continue celexa

## 2018-08-14 NOTE — Patient Instructions (Signed)
Follow up for CPE 

## 2018-09-29 DIAGNOSIS — Z1231 Encounter for screening mammogram for malignant neoplasm of breast: Secondary | ICD-10-CM | POA: Diagnosis not present

## 2018-09-29 DIAGNOSIS — Z853 Personal history of malignant neoplasm of breast: Secondary | ICD-10-CM | POA: Diagnosis not present

## 2018-10-12 ENCOUNTER — Other Ambulatory Visit: Payer: Self-pay | Admitting: Unknown Physician Specialty

## 2018-10-13 NOTE — Telephone Encounter (Signed)
[facility] called and spoke to [place], [facility] about refill requested. He says the patient picked up on 09/21/18 and will not need refills at this time.

## 2018-10-28 DIAGNOSIS — S8261XA Displaced fracture of lateral malleolus of right fibula, initial encounter for closed fracture: Secondary | ICD-10-CM | POA: Diagnosis not present

## 2018-10-28 DIAGNOSIS — S8264XA Nondisplaced fracture of lateral malleolus of right fibula, initial encounter for closed fracture: Secondary | ICD-10-CM | POA: Diagnosis not present

## 2018-10-28 DIAGNOSIS — M25571 Pain in right ankle and joints of right foot: Secondary | ICD-10-CM | POA: Diagnosis not present

## 2018-11-12 DIAGNOSIS — S8264XD Nondisplaced fracture of lateral malleolus of right fibula, subsequent encounter for closed fracture with routine healing: Secondary | ICD-10-CM | POA: Diagnosis not present

## 2018-12-03 DIAGNOSIS — S8264XD Nondisplaced fracture of lateral malleolus of right fibula, subsequent encounter for closed fracture with routine healing: Secondary | ICD-10-CM | POA: Diagnosis not present

## 2019-01-23 ENCOUNTER — Telehealth: Payer: Self-pay | Admitting: Unknown Physician Specialty

## 2019-01-23 NOTE — Telephone Encounter (Signed)
Called to schedule Medicare Annual Wellness Visit with the Nurse Health Advisor. Patient states she DOES NOT want another Medicare Annual Wellness Visit EVER. Thank you, Janace Hoard at [phone] or Skype [email or web address]

## 2019-02-09 ENCOUNTER — Ambulatory Visit: Payer: Self-pay

## 2019-02-16 ENCOUNTER — Encounter: Payer: Medicare Other | Admitting: Family Medicine

## 2019-02-17 DIAGNOSIS — I10 Essential (primary) hypertension: Secondary | ICD-10-CM | POA: Diagnosis not present

## 2019-02-17 DIAGNOSIS — I480 Paroxysmal atrial fibrillation: Secondary | ICD-10-CM | POA: Diagnosis not present

## 2019-02-18 ENCOUNTER — Ambulatory Visit (INDEPENDENT_AMBULATORY_CARE_PROVIDER_SITE_OTHER): Payer: Medicare Other | Admitting: Family Medicine

## 2019-02-18 ENCOUNTER — Encounter: Payer: Self-pay | Admitting: Family Medicine

## 2019-02-18 VITALS — BP 132/75 | HR 75 | Temp 98.5°F | Wt 160.0 lb

## 2019-02-18 DIAGNOSIS — Z0000 Encounter for general adult medical examination without abnormal findings: Secondary | ICD-10-CM | POA: Diagnosis not present

## 2019-02-18 DIAGNOSIS — Z853 Personal history of malignant neoplasm of breast: Secondary | ICD-10-CM

## 2019-02-18 DIAGNOSIS — Z1211 Encounter for screening for malignant neoplasm of colon: Secondary | ICD-10-CM | POA: Diagnosis not present

## 2019-02-18 DIAGNOSIS — F325 Major depressive disorder, single episode, in full remission: Secondary | ICD-10-CM | POA: Diagnosis not present

## 2019-02-18 DIAGNOSIS — R8290 Unspecified abnormal findings in urine: Secondary | ICD-10-CM | POA: Diagnosis not present

## 2019-02-18 DIAGNOSIS — I10 Essential (primary) hypertension: Secondary | ICD-10-CM | POA: Diagnosis not present

## 2019-02-18 MED ORDER — CITALOPRAM HYDROBROMIDE 40 MG PO TABS: 40.0000 mg | ORAL_TABLET | Freq: Every day | ORAL | 1 refills | Status: DC

## 2019-02-18 MED ORDER — LOSARTAN POTASSIUM-HCTZ 50-12.5 MG PO TABS: 1.0000 | ORAL_TABLET | Freq: Every day | ORAL | 1 refills | Status: DC

## 2019-02-18 NOTE — Assessment & Plan Note (Signed)
Stable and WNL, continue current regimen 

## 2019-02-18 NOTE — Progress Notes (Signed)
BP 132/75   Pulse 75   Temp 98.5 F (36.9 C) (Oral)   Wt 160 lb (72.6 kg)   LMP 01/03/1999 (Approximate)   SpO2 99%   BMI 25.06 kg/m    Subjective:    Patient ID: Melanie Wallace, female    DOB: 1946/02/16, 73 y.o.   MRN: [id number]  HPI: DELOYCE WALTHERS is a 73 y.o. female presenting on 02/18/2019 for comprehensive medical examination. Current medical complaints include:none  BPs stable, 120s-130s/70s when checked. Just followed up with Cardiology yesterday and got a good report. Denies CP, palpitations, SOB. Rate well controlled and tolerating eliquis well.   Moods well controlled, has been on celexa for years with no issues.   She currently lives with: Menopausal Symptoms: no  Depression Screen done today and results listed below:  Depression screen [facility] 2/9 02/18/2019 08/11/2018 02/10/2018 02/05/2018 08/06/2017  Decreased Interest 0 0 0 0 0  Down, Depressed, Hopeless 0 0 0 0 0  PHQ - 2 Score 0 0 0 0 0  Altered sleeping 0 0 0 - 0  Tired, decreased energy 0 0 0 - 0  Change in appetite 0 0 0 - 0  Feeling bad or failure about yourself  0 0 0 - 0  Trouble concentrating 0 0 0 - 0  Moving slowly or fidgety/restless 0 0 0 - 0  Suicidal thoughts 0 0 0 - 0  PHQ-9 Score 0 0 0 - 0    The patient does not have a history of falls. I did complete a risk assessment for falls. A plan of care for falls was documented.   Past Medical History:  Past Medical History:  Diagnosis Date  . Allergic rhinitis   . Atrial fibrillation ([place])   . Breast CA ([place])   . Depression   . Hypertension   . Osteoporosis   . Vaginal cancer [facility])     Surgical History:  Past Surgical History:  Procedure Laterality Date  . BREAST SURGERY  03/2015  . NASAL SEPTUM SURGERY    . TUBAL LIGATION      Medications:  Current Outpatient Medications on File Prior to Visit  Medication Sig  . apixaban (ELIQUIS) 5 MG TABS tablet Take 5 mg by mouth 2 (two) times daily.  . calcium-vitamin D (OSCAL WITH D)  500-200 MG-UNIT per tablet Take 1 tablet by mouth.  . metoprolol succinate (TOPROL-XL) 25 MG 24 hr tablet Take 25 mg by mouth daily.  . Multiple Vitamins-Minerals (OCUVITE PO) Take by mouth daily.  . traMADol (ULTRAM) 50 MG tablet Take 50 mg by mouth every 8 (eight) hours.   No current facility-administered medications on file prior to visit.     Allergies:  Allergies  Allergen Reactions  . Sulfa Antibiotics Hives  . Sulfasalazine Hives  . Penicillin G Benzathine   . Green Tea Leaf Ext Palpitations  . Pen-Vee K [Penicillin V] Rash    Social History:  Social History   Socioeconomic History  . Marital status: Married    Spouse name: Not on file  . Number of children: Not on file  . Years of education: 35  . Highest education level: High school graduate  Occupational History  . Not on file  Social Needs  . Financial resource strain: Not hard at all  . Food insecurity:    Worry: Never true    Inability: Never true  . Transportation needs:    Medical: No    Non-medical: No  Tobacco Use  .  Smoking status: Former Research scientist (life sciences)  . Smokeless tobacco: Never Used  . Tobacco comment: quit in 95- smoked for 3 years total   Substance and Sexual Activity  . Alcohol use: No    Alcohol/week: 0.0 standard drinks  . Drug use: No  . Sexual activity: Yes  Lifestyle  . Physical activity:    Days per week: 0 days    Minutes per session: 0 min  . Stress: Not at all  Relationships  . Social connections:    Talks on phone: More than three times a week    Gets together: More than three times a week    Attends religious service: More than 4 times per year    Active member of club or organization: No    Attends meetings of clubs or organizations: Never    Relationship status: Married  . Intimate partner violence:    Fear of current or ex partner: No    Emotionally abused: No    Physically abused: No    Forced sexual activity: No  Other Topics Concern  . Not on file  Social History  Narrative  . Not on file   Social History   Tobacco Use  Smoking Status Former Smoker  Smokeless Tobacco Never Used  Tobacco Comment   quit in 95- smoked for 3 years total    Social History   Substance and Sexual Activity  Alcohol Use No  . Alcohol/week: 0.0 standard drinks    Family History:  Family History  Problem Relation Age of Onset  . Cancer Mother        breast and bone  . Hypertension Mother   . Cancer Father        prostate  . Hypertension Sister   . Hypertension Daughter   . Hypothyroidism Daughter   . Allergies Daughter   . Hyperlipidemia Maternal Grandmother   . Heart attack Maternal Grandmother   . Stroke Paternal Grandfather     Past medical history, surgical history, medications, allergies, family history and social history reviewed with patient today and changes made to appropriate areas of the chart.   Review of Systems - General ROS: negative Psychological ROS: negative Ophthalmic ROS: negative ENT ROS: negative Allergy and Immunology ROS: negative Hematological and Lymphatic ROS: negative Endocrine ROS: negative Breast ROS: negative for breast lumps Respiratory ROS: no cough, shortness of breath, or wheezing Cardiovascular ROS: no chest pain or dyspnea on exertion Gastrointestinal ROS: no abdominal pain, change in bowel habits, or black or bloody stools Genito-Urinary ROS: no dysuria, trouble voiding, or hematuria Musculoskeletal ROS: negative Neurological ROS: no TIA or stroke symptoms Dermatological ROS: negative All other ROS negative except what is listed above and in the HPI.      Objective:    BP 132/75   Pulse 75   Temp 98.5 F (36.9 C) (Oral)   Wt 160 lb (72.6 kg)   LMP 01/03/1999 (Approximate)   SpO2 99%   BMI 25.06 kg/m   Wt Readings from Last 3 Encounters:  02/18/19 160 lb (72.6 kg)  08/11/18 156 lb (70.8 kg)  02/10/18 158 lb 6.4 oz (71.8 kg)    Physical Exam Vitals signs and nursing note reviewed.    Constitutional:      General: She is not in acute distress.    Appearance: She is well-developed.  HENT:     Head: Atraumatic.     Right Ear: External ear normal.     Left Ear: External ear normal.  Nose: Nose normal.     Mouth/Throat:     Pharynx: No oropharyngeal exudate.  Eyes:     General: No scleral icterus.    Conjunctiva/sclera: Conjunctivae normal.     Pupils: Pupils are equal, round, and reactive to light.  Neck:     Musculoskeletal: Normal range of motion and neck supple.     Thyroid: No thyromegaly.  Cardiovascular:     Rate and Rhythm: Normal rate and regular rhythm.     Heart sounds: Normal heart sounds.  Pulmonary:     Effort: Pulmonary effort is normal. No respiratory distress.     Breath sounds: Normal breath sounds.  Chest:     Comments: S/p right mastectomy with full reconstruction S/p partial breat implant left breast - no palpable masses, skin changes, ttp Abdominal:     General: Bowel sounds are normal.     Palpations: Abdomen is soft. There is no mass.     Tenderness: There is no abdominal tenderness.  Genitourinary:    Comments: Exam deferred with shared decision making Musculoskeletal: Normal range of motion.        General: No tenderness.  Lymphadenopathy:     Cervical: No cervical adenopathy.     Upper Body:     Right upper body: No axillary adenopathy.     Left upper body: No axillary adenopathy.  Skin:    General: Skin is warm and dry.     Findings: No rash.  Neurological:     General: No focal deficit present.     Mental Status: She is alert and oriented to person, place, and time.     Cranial Nerves: No cranial nerve deficit.  Psychiatric:        Mood and Affect: Mood normal.        Behavior: Behavior normal.        Thought Content: Thought content normal.        Judgment: Judgment normal.     Results for orders placed or performed in visit on 02/10/18  Lipid Panel w/o Chol/HDL Ratio  Result Value Ref Range   Cholesterol,  Total 166 100 - 199 mg/dL   Triglycerides 85 0 - 149 mg/dL   HDL 59 >39 mg/dL   VLDL Cholesterol Cal 17 5 - 40 mg/dL   LDL Calculated 90 0 - 99 mg/dL  Comprehensive metabolic panel  Result Value Ref Range   Glucose 87 65 - 99 mg/dL   BUN 14 8 - 27 mg/dL   Creatinine, Ser 0.68 0.57 - 1.00 mg/dL   GFR calc non Af Amer 89 >59 mL/min/1.73   GFR calc Af Amer 102 >59 mL/min/1.73   BUN/Creatinine Ratio 21 12 - 28   Sodium 141 134 - 144 mmol/L   Potassium 4.8 3.5 - 5.2 mmol/L   Chloride 100 96 - 106 mmol/L   CO2 24 20 - 29 mmol/L   Calcium 9.7 8.7 - 10.3 mg/dL   Total Protein 6.7 6.0 - 8.5 g/dL   Albumin 4.5 3.5 - 4.8 g/dL   Globulin, Total 2.2 1.5 - 4.5 g/dL   Albumin/Globulin Ratio 2.0 1.2 - 2.2   Bilirubin Total 0.6 0.0 - 1.2 mg/dL   Alkaline Phosphatase 51 39 - 117 IU/L   AST 13 0 - 40 IU/L   ALT 8 0 - 32 IU/L      Assessment & Plan:   Problem List Items Addressed This Visit      Cardiovascular and Mediastinum   Hypertension  Stable and WNL, continue current regimen      Relevant Medications   losartan-hydrochlorothiazide (HYZAAR) 50-12.5 MG tablet   Other Relevant Orders   CBC with Differential/Platelet   Comprehensive metabolic panel   TSH   UA/M w/rflx Culture, Routine     Other   Breast cancer (HCC)   Depression    Stable on celexa, continue current regimen      Relevant Medications   citalopram (CELEXA) 40 MG tablet    Other Visit Diagnoses    Annual physical exam    -  Primary   Relevant Orders   Lipid Panel w/o Chol/HDL Ratio   Screening for colon cancer       Relevant Orders   Ambulatory referral to Gastroenterology       Follow up plan: Return in about 6 months (around 08/21/2019) for BP, mood f/u.   LABORATORY TESTING:  - Pap smear: not applicable  IMMUNIZATIONS:   - Tdap: Tetanus vaccination status reviewed: last tetanus booster within 10 years. - Influenza: Up to date - Pneumovax: Up to date - Prevnar: Up to date - HPV: Not  applicable - Zostavax vaccine: Up to date  SCREENING: -Mammogram: Up to date  - Colonoscopy: Ordered today  - Bone Density: Up to date   PATIENT COUNSELING:   Advised to take 1 mg of folate supplement per day if capable of pregnancy.   Sexuality: Discussed sexually transmitted diseases, partner selection, use of condoms, avoidance of unintended pregnancy  and contraceptive alternatives.   Advised to avoid cigarette smoking.  I discussed with the patient that most people either abstain from alcohol or drink within safe limits (<=14/week and <=4 drinks/occasion for males, <=7/weeks and <= 3 drinks/occasion for females) and that the risk for alcohol disorders and other health effects rises proportionally with the number of drinks per week and how often a drinker exceeds daily limits.  Discussed cessation/primary prevention of drug use and availability of treatment for abuse.   Diet: Encouraged to adjust caloric intake to maintain  or achieve ideal body weight, to reduce intake of dietary saturated fat and total fat, to limit sodium intake by avoiding high sodium foods and not adding table salt, and to maintain adequate dietary potassium and calcium preferably from fresh fruits, vegetables, and low-fat dairy products.    stressed the importance of regular exercise  Injury prevention: Discussed safety belts, safety helmets, smoke detector, smoking near bedding or upholstery.   Dental health: Discussed importance of regular tooth brushing, flossing, and dental visits.    NEXT PREVENTATIVE PHYSICAL DUE IN 1 YEAR. Return in about 6 months (around 08/21/2019) for BP, mood f/u.

## 2019-02-18 NOTE — Assessment & Plan Note (Signed)
Stable on celexa, continue current regimen

## 2019-02-19 ENCOUNTER — Encounter: Payer: Self-pay | Admitting: Family Medicine

## 2019-02-19 LAB — COMPREHENSIVE METABOLIC PANEL
ALBUMIN: 4.7 g/dL (ref 3.7–4.7)
ALK PHOS: 51 IU/L (ref 39–117)
ALT: 13 IU/L (ref 0–32)
AST: 16 IU/L (ref 0–40)
Albumin/Globulin Ratio: 2.2 (ref 1.2–2.2)
BUN / CREAT RATIO: 22 (ref 12–28)
BUN: 15 mg/dL (ref 8–27)
Bilirubin Total: 0.5 mg/dL (ref 0.0–1.2)
CO2: 22 mmol/L (ref 20–29)
CREATININE: 0.67 mg/dL (ref 0.57–1.00)
Calcium: 9.9 mg/dL (ref 8.7–10.3)
Chloride: 101 mmol/L (ref 96–106)
GFR calc non Af Amer: 89 mL/min/{1.73_m2} (ref >59)
GFR, EST AFRICAN AMERICAN: 102 mL/min/{1.73_m2} (ref >59)
GLUCOSE: 95 mg/dL (ref 65–99)
Globulin, Total: 2.1 g/dL (ref 1.5–4.5)
Potassium: 4.3 mmol/L (ref 3.5–5.2)
Sodium: 141 mmol/L (ref 134–144)
TOTAL PROTEIN: 6.8 g/dL (ref 6.0–8.5)

## 2019-02-19 LAB — CBC WITH DIFFERENTIAL/PLATELET
BASOS ABS: 0 10*3/uL (ref 0.0–0.2)
Basos: 1 %
EOS (ABSOLUTE): 0.1 10*3/uL (ref 0.0–0.4)
Eos: 1 %
HEMOGLOBIN: 14.5 g/dL (ref 11.1–15.9)
Hematocrit: 43.5 % (ref 34.0–46.6)
IMMATURE GRANS (ABS): 0 10*3/uL (ref 0.0–0.1)
IMMATURE GRANULOCYTES: 0 %
Lymphocytes Absolute: 1.5 10*3/uL (ref 0.7–3.1)
Lymphs: 30 %
MCH: 29.8 pg (ref 26.6–33.0)
MCHC: 33.3 g/dL (ref 31.5–35.7)
MCV: 89 fL (ref 79–97)
MONOCYTES: 10 %
Monocytes Absolute: 0.5 10*3/uL (ref 0.1–0.9)
NEUTROS PCT: 58 %
Neutrophils Absolute: 2.9 10*3/uL (ref 1.4–7.0)
PLATELETS: 292 10*3/uL (ref 150–450)
RBC: 4.87 x10E6/uL (ref 3.77–5.28)
RDW: 13.1 % (ref 11.7–15.4)
WBC: 4.9 10*3/uL (ref 3.4–10.8)

## 2019-02-19 LAB — LIPID PANEL W/O CHOL/HDL RATIO
CHOLESTEROL TOTAL: 181 mg/dL (ref 100–199)
HDL: 58 mg/dL (ref >39)
LDL CALC: 103 mg/dL — AB (ref 0–99)
Triglycerides: 98 mg/dL (ref 0–149)
VLDL CHOLESTEROL CAL: 20 mg/dL (ref 5–40)

## 2019-02-19 LAB — TSH: TSH: 1.43 u[IU]/mL (ref 0.450–4.500)

## 2019-02-20 LAB — UA/M W/RFLX CULTURE, ROUTINE
BILIRUBIN UA: NEGATIVE
GLUCOSE, UA: NEGATIVE
KETONES UA: NEGATIVE
Nitrite, UA: NEGATIVE
PROTEIN UA: NEGATIVE
Specific Gravity, UA: 1.02 (ref 1.005–1.030)
UUROB: 0.2 mg/dL (ref 0.2–1.0)
pH, UA: 6.5 (ref 5.0–7.5)

## 2019-02-20 LAB — MICROSCOPIC EXAMINATION: Epithelial Cells (non renal): >10 /hpf — AB (ref 0–10)

## 2019-02-20 LAB — URINE CULTURE, REFLEX

## 2019-02-23 ENCOUNTER — Telehealth: Payer: Self-pay | Admitting: Family Medicine

## 2019-02-23 NOTE — Telephone Encounter (Signed)
Letter was printed last week, OK to read results to her  Copied from [place] [phone]. Topic: General - Other >> Feb 23, 2019 11:21 AM Rayann Heman wrote: Reason for CRM: pt calling to check status of recent labs. Please advise >> Feb 23, 2019 11:33 AM Mcneil, Jacinto Reap wrote: Pt called back to include that she is no longer taking the traMADol (ULTRAM) 50 MG tablet

## 2019-02-23 NOTE — Telephone Encounter (Signed)
Patient notified and verbalized understanding.   Patient requested Tramadol to be removed from her chart. Stated that was from a while ago and she only took a few and is no longer needed.   Tramadol d/c's from chart.

## 2019-05-06 DIAGNOSIS — Z1211 Encounter for screening for malignant neoplasm of colon: Secondary | ICD-10-CM | POA: Diagnosis not present

## 2019-07-04 DIAGNOSIS — Z1159 Encounter for screening for other viral diseases: Secondary | ICD-10-CM | POA: Diagnosis not present

## 2019-07-06 DIAGNOSIS — Z882 Allergy status to sulfonamides status: Secondary | ICD-10-CM | POA: Diagnosis not present

## 2019-07-06 DIAGNOSIS — K644 Residual hemorrhoidal skin tags: Secondary | ICD-10-CM | POA: Diagnosis not present

## 2019-07-06 DIAGNOSIS — Z87891 Personal history of nicotine dependence: Secondary | ICD-10-CM | POA: Diagnosis not present

## 2019-07-06 DIAGNOSIS — Z9011 Acquired absence of right breast and nipple: Secondary | ICD-10-CM | POA: Diagnosis not present

## 2019-07-06 DIAGNOSIS — M810 Age-related osteoporosis without current pathological fracture: Secondary | ICD-10-CM | POA: Diagnosis not present

## 2019-07-06 DIAGNOSIS — D123 Benign neoplasm of transverse colon: Secondary | ICD-10-CM | POA: Diagnosis not present

## 2019-07-06 DIAGNOSIS — Z7901 Long term (current) use of anticoagulants: Secondary | ICD-10-CM | POA: Diagnosis not present

## 2019-07-06 DIAGNOSIS — Z79899 Other long term (current) drug therapy: Secondary | ICD-10-CM | POA: Diagnosis not present

## 2019-07-06 DIAGNOSIS — K635 Polyp of colon: Secondary | ICD-10-CM | POA: Diagnosis not present

## 2019-07-06 DIAGNOSIS — I4891 Unspecified atrial fibrillation: Secondary | ICD-10-CM | POA: Diagnosis not present

## 2019-07-06 DIAGNOSIS — K219 Gastro-esophageal reflux disease without esophagitis: Secondary | ICD-10-CM | POA: Diagnosis not present

## 2019-07-06 DIAGNOSIS — K648 Other hemorrhoids: Secondary | ICD-10-CM | POA: Diagnosis not present

## 2019-07-06 DIAGNOSIS — Z880 Allergy status to penicillin: Secondary | ICD-10-CM | POA: Diagnosis not present

## 2019-07-06 DIAGNOSIS — Z1211 Encounter for screening for malignant neoplasm of colon: Secondary | ICD-10-CM | POA: Diagnosis not present

## 2019-07-06 DIAGNOSIS — Z853 Personal history of malignant neoplasm of breast: Secondary | ICD-10-CM | POA: Diagnosis not present

## 2019-07-06 DIAGNOSIS — I10 Essential (primary) hypertension: Secondary | ICD-10-CM | POA: Diagnosis not present

## 2019-07-06 LAB — HM COLONOSCOPY

## 2019-08-18 DIAGNOSIS — I10 Essential (primary) hypertension: Secondary | ICD-10-CM | POA: Diagnosis not present

## 2019-08-18 DIAGNOSIS — I480 Paroxysmal atrial fibrillation: Secondary | ICD-10-CM | POA: Diagnosis not present

## 2019-08-21 ENCOUNTER — Telehealth: Payer: Self-pay | Admitting: Family Medicine

## 2019-08-21 DIAGNOSIS — Z1239 Encounter for other screening for malignant neoplasm of breast: Secondary | ICD-10-CM

## 2019-08-21 NOTE — Telephone Encounter (Signed)
Order placed

## 2019-08-21 NOTE — Telephone Encounter (Signed)
Called pt told her that orders where place via [place]. Pt verbalized understanding.

## 2019-08-21 NOTE — Addendum Note (Signed)
Addended by: Merrie Roof E on: 08/21/2019 04:29 PM   Modules accepted: Orders

## 2019-08-21 NOTE — Telephone Encounter (Signed)
Called pt about her upcoming appt, she is wanting to know if referral for mammogram can be put in so she can get it done in October. Please advise.

## 2019-08-25 ENCOUNTER — Ambulatory Visit: Payer: Medicare Other | Admitting: Family Medicine

## 2019-08-26 ENCOUNTER — Encounter: Payer: Self-pay | Admitting: Family Medicine

## 2019-08-26 ENCOUNTER — Other Ambulatory Visit: Payer: Self-pay | Admitting: Family Medicine

## 2019-08-26 ENCOUNTER — Ambulatory Visit (INDEPENDENT_AMBULATORY_CARE_PROVIDER_SITE_OTHER): Payer: Medicare Other | Admitting: Family Medicine

## 2019-08-26 ENCOUNTER — Other Ambulatory Visit: Payer: Self-pay

## 2019-08-26 ENCOUNTER — Telehealth: Payer: Self-pay

## 2019-08-26 VITALS — BP 126/76 | HR 79 | Temp 98.6°F | Ht 67.0 in | Wt 160.0 lb

## 2019-08-26 DIAGNOSIS — B351 Tinea unguium: Secondary | ICD-10-CM | POA: Diagnosis not present

## 2019-08-26 DIAGNOSIS — I10 Essential (primary) hypertension: Secondary | ICD-10-CM

## 2019-08-26 DIAGNOSIS — M79675 Pain in left toe(s): Secondary | ICD-10-CM | POA: Diagnosis not present

## 2019-08-26 DIAGNOSIS — Z23 Encounter for immunization: Secondary | ICD-10-CM | POA: Diagnosis not present

## 2019-08-26 DIAGNOSIS — F325 Major depressive disorder, single episode, in full remission: Secondary | ICD-10-CM | POA: Diagnosis not present

## 2019-08-26 MED ORDER — LOSARTAN POTASSIUM-HCTZ 50-12.5 MG PO TABS: 1.0000 | ORAL_TABLET | Freq: Every day | ORAL | 1 refills | Status: DC

## 2019-08-26 MED ORDER — CITALOPRAM HYDROBROMIDE 40 MG PO TABS: 40.0000 mg | ORAL_TABLET | Freq: Every day | ORAL | 1 refills | Status: DC

## 2019-08-26 NOTE — Telephone Encounter (Signed)
[facility] order filled out, will get it signed and fax.  Copied from [place] [phone]. Topic: General - Other >> Aug 26, 2019  9:03 AM Rainey Pines A wrote: Patient stated that she contacted the place that she was told she was referred to and the office stated that they didn't have any record of referral. Patient would like referral faxed over to [place] at [phone]. Patient requesting callback once referral has been faxed.

## 2019-08-26 NOTE — Telephone Encounter (Signed)
Pt called back in to follow up. Advised that order has been faxed. Pt says that she will call and make her apt.

## 2019-08-26 NOTE — Telephone Encounter (Signed)
Order faxed.

## 2019-08-26 NOTE — Progress Notes (Signed)
BP 126/76 (BP Location: Left Arm, Patient Position: Sitting, Cuff Size: Normal)   Pulse 79   Temp 98.6 F (37 C) (Oral)   Ht 5\' 7"  (1.702 m)   Wt 160 lb (72.6 kg)   LMP 01/03/1999 (Approximate)   SpO2 98%   BMI 25.06 kg/m    Subjective:    Patient ID: Melanie Wallace, female    DOB: 07/15/1946, 73 y.o.   MRN: [id number]  HPI: Melanie Wallace is a 73 y.o. female  Chief Complaint  Patient presents with  . Toenail Problem (Cracked)    Left Big Toenail  . Hypertension  . Depression   Patient presents today for 6 month f/u chronic conditions.   Main concern today is thickening, cracking, and ingrown tendency to left great toenail the past 6 months or so. Has to often use a straight tool to make sure the nail doesn't grow into her skin. No injury or known nail issues in the past.   HTN - Home readings 120s/70s, tolerating medications well. Sees Cardiology regularly, just got a good report at her follow up with them a few weeks ago. Denies CP, SOB, HAs, dizziness.   Depression - has been on celexa for years, tolerates it very well and feels it controls moods well. Denies crying spells, anhedonia, SI/HI.   Depression screen [facility] 2/9 08/26/2019 02/18/2019 08/11/2018  Decreased Interest 0 0 0  Down, Depressed, Hopeless 0 0 0  PHQ - 2 Score 0 0 0  Altered sleeping 0 0 0  Tired, decreased energy 0 0 0  Change in appetite 0 0 0  Feeling bad or failure about yourself  0 0 0  Trouble concentrating 0 0 0  Moving slowly or fidgety/restless 0 0 0  Suicidal thoughts 0 0 0  PHQ-9 Score 0 0 0  Difficult doing work/chores Not difficult at all - -    Relevant past medical, surgical, family and social history reviewed and updated as indicated. Interim medical history since our last visit reviewed. Allergies and medications reviewed and updated.  Review of Systems  Per HPI unless specifically indicated above     Objective:    BP 126/76 (BP Location: Left Arm, Patient Position: Sitting,  Cuff Size: Normal)   Pulse 79   Temp 98.6 F (37 C) (Oral)   Ht 5\' 7"  (1.702 m)   Wt 160 lb (72.6 kg)   LMP 01/03/1999 (Approximate)   SpO2 98%   BMI 25.06 kg/m   Wt Readings from Last 3 Encounters:  08/26/19 160 lb (72.6 kg)  02/18/19 160 lb (72.6 kg)  08/11/18 156 lb (70.8 kg)    Physical Exam Vitals signs and nursing note reviewed.  Constitutional:      Appearance: Normal appearance. She is not ill-appearing.  HENT:     Head: Atraumatic.  Eyes:     Extraocular Movements: Extraocular movements intact.     Conjunctiva/sclera: Conjunctivae normal.  Neck:     Musculoskeletal: Normal range of motion and neck supple.  Cardiovascular:     Rate and Rhythm: Normal rate and regular rhythm.     Heart sounds: Normal heart sounds.  Pulmonary:     Effort: Pulmonary effort is normal.     Breath sounds: Normal breath sounds.  Musculoskeletal: Normal range of motion.  Skin:    General: Skin is warm and dry.  Neurological:     Mental Status: She is alert and oriented to person, place, and time.  Psychiatric:  Mood and Affect: Mood normal.        Thought Content: Thought content normal.        Judgment: Judgment normal.     Results for orders placed or performed in visit on 02/18/19  Microscopic Examination   URINE  Result Value Ref Range   WBC, UA 11-30 (A) 0 - 5 /hpf   RBC, UA 3-10 (A) 0 - 2 /hpf   Epithelial Cells (non renal) >10 (A) 0 - 10 /hpf   Bacteria, UA Moderate (A) None seen/Few  Urine Culture, Reflex   URINE  Result Value Ref Range   Urine Culture, Routine Final report    Organism ID, Bacteria Comment   CBC with Differential/Platelet  Result Value Ref Range   WBC 4.9 3.4 - 10.8 x10E3/uL   RBC 4.87 3.77 - 5.28 x10E6/uL   Hemoglobin 14.5 11.1 - 15.9 g/dL   Hematocrit 43.5 34.0 - 46.6 %   MCV 89 79 - 97 fL   MCH 29.8 26.6 - 33.0 pg   MCHC 33.3 31.5 - 35.7 g/dL   RDW 13.1 11.7 - 15.4 %   Platelets 292 150 - 450 x10E3/uL   Neutrophils 58 Not Estab. %    Lymphs 30 Not Estab. %   Monocytes 10 Not Estab. %   Eos 1 Not Estab. %   Basos 1 Not Estab. %   Neutrophils Absolute 2.9 1.4 - 7.0 x10E3/uL   Lymphocytes Absolute 1.5 0.7 - 3.1 x10E3/uL   Monocytes Absolute 0.5 0.1 - 0.9 x10E3/uL   EOS (ABSOLUTE) 0.1 0.0 - 0.4 x10E3/uL   Basophils Absolute 0.0 0.0 - 0.2 x10E3/uL   Immature Granulocytes 0 Not Estab. %   Immature Grans (Abs) 0.0 0.0 - 0.1 x10E3/uL  Comprehensive metabolic panel  Result Value Ref Range   Glucose 95 65 - 99 mg/dL   BUN 15 8 - 27 mg/dL   Creatinine, Ser 0.67 0.57 - 1.00 mg/dL   GFR calc non Af Amer 89 >59 mL/min/1.73   GFR calc Af Amer 102 >59 mL/min/1.73   BUN/Creatinine Ratio 22 12 - 28   Sodium 141 134 - 144 mmol/L   Potassium 4.3 3.5 - 5.2 mmol/L   Chloride 101 96 - 106 mmol/L   CO2 22 20 - 29 mmol/L   Calcium 9.9 8.7 - 10.3 mg/dL   Total Protein 6.8 6.0 - 8.5 g/dL   Albumin 4.7 3.7 - 4.7 g/dL   Globulin, Total 2.1 1.5 - 4.5 g/dL   Albumin/Globulin Ratio 2.2 1.2 - 2.2   Bilirubin Total 0.5 0.0 - 1.2 mg/dL   Alkaline Phosphatase 51 39 - 117 IU/L   AST 16 0 - 40 IU/L   ALT 13 0 - 32 IU/L  Lipid Panel w/o Chol/HDL Ratio  Result Value Ref Range   Cholesterol, Total 181 100 - 199 mg/dL   Triglycerides 98 0 - 149 mg/dL   HDL 58 >39 mg/dL   VLDL Cholesterol Cal 20 5 - 40 mg/dL   LDL Calculated 103 (H) 0 - 99 mg/dL  TSH  Result Value Ref Range   TSH 1.430 0.450 - 4.500 uIU/mL  UA/M w/rflx Culture, Routine   Specimen: Urine   URINE  Result Value Ref Range   Specific Gravity, UA 1.020 1.005 - 1.030   pH, UA 6.5 5.0 - 7.5   Color, UA Yellow Yellow   Appearance Ur Cloudy (A) Clear   Leukocytes, UA 3+ (A) Negative   Protein, UA Negative Negative/Trace   Glucose,  UA Negative Negative   Ketones, UA Negative Negative   RBC, UA 2+ (A) Negative   Bilirubin, UA Negative Negative   Urobilinogen, Ur 0.2 0.2 - 1.0 mg/dL   Nitrite, UA Negative Negative   Microscopic Examination See below:    Urinalysis Reflex  Comment       Assessment & Plan:   Problem List Items Addressed This Visit      Cardiovascular and Mediastinum   Hypertension - Primary    Stable and WNL, continue current regimen      Relevant Medications   losartan-hydrochlorothiazide (HYZAAR) 50-12.5 MG tablet   Other Relevant Orders   Basic metabolic panel     Other   Depression    Chronic, stable and under good control. Continue current regimen      Relevant Medications   citalopram (CELEXA) 40 MG tablet    Other Visit Diagnoses    Onychomycosis       Discussed OTC treatments, foot care. Will refer to Podiatry given extent of thickening/cracking and tendency to grow into skin   Relevant Orders   Ambulatory referral to Podiatry   Great toe pain, left       Relevant Orders   Ambulatory referral to Podiatry   Need for influenza vaccination       Relevant Orders   Flu Vaccine QUAD High Dose(Fluad) (Completed)       Follow up plan: Return in about 6 months (around 02/23/2020) for CPE.

## 2019-08-26 NOTE — Patient Instructions (Signed)

## 2019-08-27 NOTE — Assessment & Plan Note (Signed)
Stable and WNL, continue current regimen 

## 2019-08-27 NOTE — Assessment & Plan Note (Signed)
Chronic, stable and under good control. Continue current regimen °

## 2019-09-04 ENCOUNTER — Ambulatory Visit (INDEPENDENT_AMBULATORY_CARE_PROVIDER_SITE_OTHER): Payer: Medicare Other | Admitting: Podiatry

## 2019-09-04 ENCOUNTER — Encounter: Payer: Self-pay | Admitting: Podiatry

## 2019-09-04 ENCOUNTER — Other Ambulatory Visit: Payer: Self-pay

## 2019-09-04 DIAGNOSIS — L603 Nail dystrophy: Secondary | ICD-10-CM

## 2019-09-04 DIAGNOSIS — L989 Disorder of the skin and subcutaneous tissue, unspecified: Secondary | ICD-10-CM | POA: Diagnosis not present

## 2019-09-04 MED ORDER — GENTAMICIN SULFATE 0.1 % EX CREA: 1.0000 "application " | TOPICAL_CREAM | Freq: Two times a day (BID) | CUTANEOUS | 1 refills | Status: DC

## 2019-09-04 NOTE — Patient Instructions (Signed)

## 2019-09-06 NOTE — Progress Notes (Signed)
   Subjective: 73 y.o. female presenting today as a new patient with a chief complaint of possible nail fungus of the left great toe that has been present for the past 2-3 years. She states the nail has cracked in the past and has grown back ingrown. She also notes a painful callus lesion on the plantar left foot. She has been soaking the feet in vinegar for treatment. She denies any modifying factors. Patient is here for further evaluation and treatment.   Past Medical History:  Diagnosis Date  . Allergic rhinitis   . Atrial fibrillation ([place])   . Breast CA ([place])   . Depression   . Hypertension   . Osteoporosis   . 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 Hyperkeratotic lesion(s) present on the left foot. Pain on palpation with a central nucleated core noted. Skin is warm, dry and supple bilateral lower extremities. Negative for open lesions or macerations.  Vascular: Dorsalis Pedis artery and Posterior Tibial artery pedal pulses palpable. No lower extremity edema noted.   Neruologic: Grossly intact via light touch bilateral.  Musculoskeletal: Muscular strength within normal limits in all groups bilateral. Normal range of motion noted to all pedal and ankle joints.   Assessment:  #1 Dystrophic nail of the left great toe #2 Porokeratosis left foot x 1  Plan of Care:  1. Patient evaluated.  2. Discussed treatment alternatives and plan of care. Explained nail avulsion procedure and post procedure course to patient. 3. Patient opted for total temporary nail avulsion of the left great toe.  4. Prior to procedure, local anesthesia infiltration utilized using 3 ml of a 50:50 mixture of 2% plain lidocaine and 0.5% plain marcaine in a normal hallux block fashion and a betadine prep performed.  5. Light dressing applied.  6. Excisional debridement of keratotic lesion(s) using a chisel blade was performed without incident. Light dressing applied.  7. Prescription for Gentamicin cream provided to patient to use daily with a bandage.  8. Return to clinic as needed.   Edrick Kins, DPM [facility]  Dr. Edrick Kins, [place]                                        [place], [place] [address]                Office [phone]  Fax [phone]

## 2019-09-17 ENCOUNTER — Ambulatory Visit: Payer: Medicare Other | Admitting: Podiatry

## 2019-09-17 ENCOUNTER — Other Ambulatory Visit: Payer: Self-pay

## 2019-09-17 ENCOUNTER — Encounter: Payer: Self-pay | Admitting: Podiatry

## 2019-09-17 DIAGNOSIS — Z09 Encounter for follow-up examination after completed treatment for conditions other than malignant neoplasm: Secondary | ICD-10-CM | POA: Insufficient documentation

## 2019-09-17 NOTE — Progress Notes (Signed)
This patient returns to the office following nail surgery two  week ago.  The patient says toe has been soaked and bandaged as directed.  There has been improvement of the toe since the surgery has been performed. The patient presents for continued evaluation and treatment.  GENERAL APPEARANCE: Alert, conversant. Appropriately groomed. No acute distress.  VASCULAR: Pedal pulses palpable at  [facility] and PT bilateral.  Capillary refill time is immediate to all digits,  Normal temperature gradient.    NEUROLOGIC: sensation is normal to 5.07 monofilament at 5/5 sites bilateral.  Light touch is intact bilateral, Muscle strength normal.  MUSCULOSKELETAL: acceptable muscle strength, tone and stability bilateral.  Intrinsic muscluature intact bilateral.  Rectus appearance of foot and digits noted bilateral.   DERMATOLOGIC: skin color, texture, and turgor are within normal limits.  No preulcerative lesions or ulcers  are seen, no interdigital maceration noted.   NAILS  Healing to the nail bed is noted.  No infection noted.  DX  S/p nail surgery  ROV  Home instructions were discussed.  Patient to call the office if there are any questions or concerns.   Gardiner Barefoot DPM

## 2019-10-02 DIAGNOSIS — R921 Mammographic calcification found on diagnostic imaging of breast: Secondary | ICD-10-CM | POA: Diagnosis not present

## 2019-10-02 DIAGNOSIS — R922 Inconclusive mammogram: Secondary | ICD-10-CM | POA: Diagnosis not present

## 2019-10-02 DIAGNOSIS — Z853 Personal history of malignant neoplasm of breast: Secondary | ICD-10-CM | POA: Diagnosis not present

## 2019-10-02 DIAGNOSIS — Z9882 Breast implant status: Secondary | ICD-10-CM | POA: Diagnosis not present

## 2019-12-15 ENCOUNTER — Telehealth: Payer: Self-pay | Admitting: Family Medicine

## 2019-12-15 NOTE — Chronic Care Management (AMB) (Signed)
  Chronic Care Management   Note  12/15/2019 Name: DUSTY WAGONER MRN: [id number] DOB: July 02, 1946  SONOMA FIRKUS is a 73 y.o. year old female who is a primary care patient of [facility], [place]. I reached out to [name] by phone today in response to a referral sent by Ms. Marcy Panning [place]'s health plan.     Ms. Umeda was given information about Chronic Care Management services today including:  1. CCM service includes personalized support from designated clinical staff supervised by her physician, including individualized plan of care and coordination with other care providers 2. 24/7 contact phone numbers for assistance for urgent and routine care needs. 3. Service will only be billed when office clinical staff spend 20 minutes or more in a month to coordinate care. 4. Only one practitioner may furnish and bill the service in a calendar month. 5. The patient may stop CCM services at any time (effective at the end of the month) by phone call to the office staff. 6. The patient will be responsible for cost sharing (co-pay) of up to 20% of the service fee (after annual deductible is met).  Patient did not agree to enrollment in care management services and does not wish to consider at this time.  Follow up plan: The patient has been provided with contact information for the chronic care management team and has been advised to call with any health related questions or concerns.   [place], [place] [address] Direct Dial: [email or web address]  Website: [email or web address]

## 2020-02-11 ENCOUNTER — Ambulatory Visit: Payer: Medicare Other | Attending: Internal Medicine

## 2020-02-11 DIAGNOSIS — Z23 Encounter for immunization: Secondary | ICD-10-CM | POA: Insufficient documentation

## 2020-02-11 NOTE — Progress Notes (Signed)
   Covid-19 Vaccination Clinic  Name:  Melanie Wallace    MRN: [id number] DOB: [birth year]  02/11/2020  [name] was observed post Covid-19 immunization for 15 minutes without incidence. She was provided with Vaccine Information Sheet and instruction to access the V-Safe system.   [name] was instructed to call 911 with any severe reactions post vaccine: Marland Kitchen Difficulty breathing  . Swelling of your face and throat  . A fast heartbeat  . A bad rash all over your body  . Dizziness and weakness    Immunizations Administered    Name Date Dose VIS Date Route   Pfizer COVID-19 Vaccine 02/11/2020  9:16 AM 0.3 mL 11/27/2019 Intramuscular   Manufacturer: [place]   Lot: [id number]   [place]: [id number]

## 2020-02-18 DIAGNOSIS — I10 Essential (primary) hypertension: Secondary | ICD-10-CM | POA: Diagnosis not present

## 2020-02-18 DIAGNOSIS — I480 Paroxysmal atrial fibrillation: Secondary | ICD-10-CM | POA: Diagnosis not present

## 2020-02-22 ENCOUNTER — Other Ambulatory Visit: Payer: Self-pay | Admitting: Family Medicine

## 2020-02-22 NOTE — Telephone Encounter (Signed)
Requested Prescriptions  Pending Prescriptions Disp Refills  . losartan-hydrochlorothiazide (HYZAAR) 50-12.5 MG tablet [Pharmacy Med Name: LOSARTAN-HCTZ 50-12.5 MG TAB] 90 tablet 1    Sig: TAKE 1 TABLET BY MOUTH EVERY DAY     Cardiovascular: ARB + Diuretic Combos Failed - 02/22/2020  1:06 AM      Failed - K in normal range and within 180 days    Potassium  Date Value Ref Range Status  02/18/2019 4.3 3.5 - 5.2 mmol/L Final         Failed - Na in normal range and within 180 days    Sodium  Date Value Ref Range Status  02/18/2019 141 134 - 144 mmol/L Final         Failed - Cr in normal range and within 180 days    Creatinine, Ser  Date Value Ref Range Status  02/18/2019 0.67 0.57 - 1.00 mg/dL Final         Failed - Ca in normal range and within 180 days    Calcium  Date Value Ref Range Status  02/18/2019 9.9 8.7 - 10.3 mg/dL Final         Passed - Patient is not pregnant      Passed - Last BP in normal range    BP Readings from Last 1 Encounters:  08/26/19 126/76         Passed - Valid encounter within last 6 months    Recent Outpatient Visits          6 months ago Essential hypertension   [facility], [place]   1 year ago Annual physical exam   [facility], [place]   1 year ago Essential hypertension   [facility], [place]   2 years ago Essential hypertension   [place], [place], NP   2 years ago Essential hypertension   [place], [place], NP      Future Appointments            In 4 days Orene Desanctis, Lilia Argue, [place], [place]

## 2020-02-26 ENCOUNTER — Other Ambulatory Visit: Payer: Self-pay

## 2020-02-26 ENCOUNTER — Encounter: Payer: Self-pay | Admitting: Family Medicine

## 2020-02-26 ENCOUNTER — Ambulatory Visit (INDEPENDENT_AMBULATORY_CARE_PROVIDER_SITE_OTHER): Payer: Medicare Other | Admitting: Family Medicine

## 2020-02-26 VITALS — BP 135/83 | HR 81 | Temp 98.1°F | Ht 65.5 in | Wt 165.0 lb

## 2020-02-26 DIAGNOSIS — E7800 Pure hypercholesterolemia, unspecified: Secondary | ICD-10-CM

## 2020-02-26 DIAGNOSIS — R310 Gross hematuria: Secondary | ICD-10-CM | POA: Diagnosis not present

## 2020-02-26 DIAGNOSIS — I4891 Unspecified atrial fibrillation: Secondary | ICD-10-CM | POA: Diagnosis not present

## 2020-02-26 DIAGNOSIS — Z853 Personal history of malignant neoplasm of breast: Secondary | ICD-10-CM

## 2020-02-26 DIAGNOSIS — M810 Age-related osteoporosis without current pathological fracture: Secondary | ICD-10-CM | POA: Diagnosis not present

## 2020-02-26 DIAGNOSIS — J3089 Other allergic rhinitis: Secondary | ICD-10-CM

## 2020-02-26 DIAGNOSIS — Z0000 Encounter for general adult medical examination without abnormal findings: Secondary | ICD-10-CM | POA: Diagnosis not present

## 2020-02-26 DIAGNOSIS — I10 Essential (primary) hypertension: Secondary | ICD-10-CM | POA: Diagnosis not present

## 2020-02-26 DIAGNOSIS — F325 Major depressive disorder, single episode, in full remission: Secondary | ICD-10-CM

## 2020-02-27 LAB — CBC WITH DIFFERENTIAL/PLATELET
Basophils Absolute: 0 10*3/uL (ref 0.0–0.2)
Basos: 1 %
EOS (ABSOLUTE): 0.4 10*3/uL (ref 0.0–0.4)
Eos: 7 %
Hematocrit: 44 % (ref 34.0–46.6)
Hemoglobin: 14.5 g/dL (ref 11.1–15.9)
Immature Grans (Abs): 0 10*3/uL (ref 0.0–0.1)
Immature Granulocytes: 0 %
Lymphocytes Absolute: 1.4 10*3/uL (ref 0.7–3.1)
Lymphs: 24 %
MCH: 29.8 pg (ref 26.6–33.0)
MCHC: 33 g/dL (ref 31.5–35.7)
MCV: 91 fL (ref 79–97)
Monocytes Absolute: 0.5 10*3/uL (ref 0.1–0.9)
Monocytes: 8 %
Neutrophils Absolute: 3.6 10*3/uL (ref 1.4–7.0)
Neutrophils: 60 %
Platelets: 301 10*3/uL (ref 150–450)
RBC: 4.86 x10E6/uL (ref 3.77–5.28)
RDW: 12.3 % (ref 11.7–15.4)
WBC: 6 10*3/uL (ref 3.4–10.8)

## 2020-02-27 LAB — COMPREHENSIVE METABOLIC PANEL
ALT: 11 IU/L (ref 0–32)
AST: 16 IU/L (ref 0–40)
Albumin/Globulin Ratio: 2.3 — ABNORMAL HIGH (ref 1.2–2.2)
Albumin: 4.6 g/dL (ref 3.7–4.7)
Alkaline Phosphatase: 66 IU/L (ref 39–117)
BUN/Creatinine Ratio: 19 (ref 12–28)
BUN: 13 mg/dL (ref 8–27)
Bilirubin Total: 0.6 mg/dL (ref 0.0–1.2)
CO2: 23 mmol/L (ref 20–29)
Calcium: 9.5 mg/dL (ref 8.7–10.3)
Chloride: 101 mmol/L (ref 96–106)
Creatinine, Ser: 0.67 mg/dL (ref 0.57–1.00)
GFR calc Af Amer: 102 mL/min/{1.73_m2} (ref >59)
GFR calc non Af Amer: 88 mL/min/{1.73_m2} (ref >59)
Globulin, Total: 2 g/dL (ref 1.5–4.5)
Glucose: 84 mg/dL (ref 65–99)
Potassium: 4.2 mmol/L (ref 3.5–5.2)
Sodium: 139 mmol/L (ref 134–144)
Total Protein: 6.6 g/dL (ref 6.0–8.5)

## 2020-02-27 LAB — LIPID PANEL W/O CHOL/HDL RATIO
Cholesterol, Total: 178 mg/dL (ref 100–199)
HDL: 57 mg/dL (ref >39)
LDL Chol Calc (NIH): 104 mg/dL — ABNORMAL HIGH (ref 0–99)
Triglycerides: 91 mg/dL (ref 0–149)
VLDL Cholesterol Cal: 17 mg/dL (ref 5–40)

## 2020-02-27 LAB — TSH: TSH: 1.6 u[IU]/mL (ref 0.450–4.500)

## 2020-02-28 ENCOUNTER — Encounter: Payer: Self-pay | Admitting: Family Medicine

## 2020-02-28 LAB — MICROSCOPIC EXAMINATION: Bacteria, UA: NONE SEEN

## 2020-02-28 LAB — UA/M W/RFLX CULTURE, ROUTINE
Bilirubin, UA: NEGATIVE
Glucose, UA: NEGATIVE
Ketones, UA: NEGATIVE
Nitrite, UA: NEGATIVE
Protein,UA: NEGATIVE
Specific Gravity, UA: 1.02 (ref 1.005–1.030)
Urobilinogen, Ur: 0.2 mg/dL (ref 0.2–1.0)
pH, UA: 7 (ref 5.0–7.5)

## 2020-02-28 LAB — URINE CULTURE, REFLEX

## 2020-02-28 NOTE — Assessment & Plan Note (Signed)
Chronic, stable and well controlled. Continue current regimen °

## 2020-02-28 NOTE — Progress Notes (Signed)
BP 135/83   Pulse 81   Temp 98.1 F (36.7 C) (Oral)   Ht 5' 5.5" (1.664 m)   Wt 165 lb (74.8 kg)   LMP 01/03/1999 (Approximate)   SpO2 99%   BMI 27.04 kg/m    Subjective:    Patient ID: Melanie Wallace, female    DOB: 02-Oct-1946, 74 y.o.   MRN: [id number]  HPI: Melanie Wallace is a 74 y.o. female presenting on 02/26/2020 for comprehensive medical examination. Current medical complaints include:see below  HTN, atrial fibrillation - followed by Cardiology, saw them last week with good report. Home BPs running 120s-130s/80s on average. Taking meidcations fatihfully without side effects. Denies CP, SOB, HAs, dizziness.   Allergic rhinitis - under good control with prn antihistamines and flonase regimen  Osteoporosis - on calcium and vit D regimen, no recent injuries or fractures. Exercises daily  Depression - continues to feel well with celexa regimen. Taking faithfully, no side effects. Denies SI/HI.   Hx of breast cancer - released from Oncology several years ago, gets annual screening mammograms and does home breast exams. No new concerns or changes.   She currently lives with: Menopausal Symptoms: no  Depression Screen done today and results listed below:  Depression screen [facility] 2/9 02/26/2020 08/26/2019 02/18/2019 08/11/2018 02/10/2018  Decreased Interest 0 0 0 0 0  Down, Depressed, Hopeless 0 0 0 0 0  PHQ - 2 Score 0 0 0 0 0  Altered sleeping 0 0 0 0 0  Tired, decreased energy 0 0 0 0 0  Change in appetite 0 0 0 0 0  Feeling bad or failure about yourself  0 0 0 0 0  Trouble concentrating 0 0 0 0 0  Moving slowly or fidgety/restless 0 0 0 0 0  Suicidal thoughts 0 0 0 0 0  PHQ-9 Score 0 0 0 0 0  Difficult doing work/chores - Not difficult at all - - -    The patient does not have a history of falls. I did complete a risk assessment for falls. A plan of care for falls was documented.   Past Medical History:  Past Medical History:  Diagnosis Date  . Allergic rhinitis     . Atrial fibrillation ([address])   . Breast CA ([place])   . Depression   . Hypertension   . Osteoporosis   . 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 BREAST SURGERY  03/2015  . NASAL SEPTUM SURGERY    . TUBAL LIGATION      Medications:  Current Outpatient Medications on File Prior to Visit  Medication Sig  . apixaban (ELIQUIS) 5 MG TABS tablet Take 5 mg by mouth 2 (two) times daily.  . calcium-vitamin D (OSCAL WITH D) 500-200 MG-UNIT per tablet Take 1 tablet by mouth.  . citalopram (CELEXA) 40 MG tablet Take 1 tablet (40 mg total) by mouth at bedtime.  Marland Kitchen losartan-hydrochlorothiazide (HYZAAR) 50-12.5 MG tablet TAKE 1 TABLET BY MOUTH EVERY DAY  . metoprolol succinate (TOPROL-XL) 25 MG 24 hr tablet Take 25 mg by mouth daily.  . Multiple Vitamins-Minerals (OCUVITE PO) Take by mouth daily.   No current facility-administered medications on file prior to visit.    Allergies:  Allergies  Allergen Reactions  . Sulfa Antibiotics Hives  . Sulfasalazine Hives  . Penicillin G Benzathine   . Green Tea Leaf Ext Palpitations  . Pen-Vee K [Penicillin V] Rash  Social History:  Social History   Socioeconomic History  . Marital status: Married    Spouse name: Not on file  . Number of children: Not on file  . Years of education: 38  . Highest education level: High school graduate  Occupational History  . Not on file  Tobacco Use  . Smoking status: Former Research scientist (life sciences)  . Smokeless tobacco: Never Used  . Tobacco comment: quit in 95- smoked for 3 years total   Substance and Sexual Activity  . Alcohol use: No    Alcohol/week: 0.0 standard drinks  . Drug use: No  . Sexual activity: Yes  Other Topics Concern  . Not on file  Social History Narrative  . Not on file   Social Determinants of Health   Financial Resource Strain:   . Difficulty of Paying Living Expenses:   Food Insecurity:   . Worried About Charity fundraiser in the  Last Year:   . Arboriculturist in the Last Year:   Transportation Needs:   . Film/video editor (Medical):   Marland Kitchen Lack of Transportation (Non-Medical):   Physical Activity:   . Days of Exercise per Week:   . Minutes of Exercise per Session:   Stress:   . Feeling of Stress :   Social Connections:   . Frequency of Communication with Friends and Family:   . Frequency of Social Gatherings with Friends and Family:   . Attends Religious Services:   . Active Member of Clubs or Organizations:   . Attends Archivist Meetings:   Marland Kitchen Marital Status:   Intimate Partner Violence:   . Fear of Current or Ex-Partner:   . Emotionally Abused:   Marland Kitchen Physically Abused:   . Sexually Abused:    Social History   Tobacco Use  Smoking Status Former Smoker  Smokeless Tobacco Never Used  Tobacco Comment   quit in 95- smoked for 3 years total    Social History   Substance and Sexual Activity  Alcohol Use No  . Alcohol/week: 0.0 standard drinks    Family History:  Family History  Problem Relation Age of Onset  . Cancer Mother        breast and bone  . Hypertension Mother   . Cancer Father        prostate  . Hypertension Sister   . Hypertension Daughter   . Hypothyroidism Daughter   . Allergies Daughter   . Hyperlipidemia Maternal Grandmother   . Heart attack Maternal Grandmother   . Stroke Paternal Grandfather     Past medical history, surgical history, medications, allergies, family history and social history reviewed with patient today and changes made to appropriate areas of the chart.   Review of Systems - General ROS: negative Psychological ROS: negative Ophthalmic ROS: negative ENT ROS: negative Allergy and Immunology ROS: negative Hematological and Lymphatic ROS: negative Endocrine ROS: negative Breast ROS: negative for breast lumps Respiratory ROS: no cough, shortness of breath, or wheezing Cardiovascular ROS: no chest pain or dyspnea on exertion Gastrointestinal  ROS: no abdominal pain, change in bowel habits, or black or bloody stools Genito-Urinary ROS: no dysuria, trouble voiding, or hematuria Musculoskeletal ROS: negative Neurological ROS: no TIA or stroke symptoms Dermatological ROS: negative All other ROS negative except what is listed above and in the HPI.      Objective:    BP 135/83   Pulse 81   Temp 98.1 F (36.7 C) (Oral)   Ht 5' 5.5" (1.664  m)   Wt 165 lb (74.8 kg)   LMP 01/03/1999 (Approximate)   SpO2 99%   BMI 27.04 kg/m   Wt Readings from Last 3 Encounters:  02/26/20 165 lb (74.8 kg)  08/26/19 160 lb (72.6 kg)  02/18/19 160 lb (72.6 kg)    Physical Exam Vitals and nursing note reviewed.  Constitutional:      General: She is not in acute distress.    Appearance: She is well-developed.  HENT:     Head: Atraumatic.     Right Ear: External ear normal.     Left Ear: External ear normal.     Nose: Nose normal.     Mouth/Throat:     Pharynx: No oropharyngeal exudate.  Eyes:     General: No scleral icterus.    Conjunctiva/sclera: Conjunctivae normal.     Pupils: Pupils are equal, round, and reactive to light.  Neck:     Thyroid: No thyromegaly.  Cardiovascular:     Rate and Rhythm: Normal rate and regular rhythm.     Heart sounds: Normal heart sounds.  Pulmonary:     Effort: Pulmonary effort is normal. No respiratory distress.     Breath sounds: Normal breath sounds.  Chest:     Comments: Well healed surgical incisions b/l breasts Right breast with full implant No new masses, skin changes, breast ttp Abdominal:     General: Bowel sounds are normal.     Palpations: Abdomen is soft. There is no mass.     Tenderness: There is no abdominal tenderness.  Musculoskeletal:        General: No tenderness. Normal range of motion.     Cervical back: Normal range of motion and neck supple.  Lymphadenopathy:     Cervical: No cervical adenopathy.     Upper Body:     Right upper body: No axillary adenopathy.     Left  upper body: No axillary adenopathy.  Skin:    General: Skin is warm and dry.     Findings: No rash.  Neurological:     Mental Status: She is alert and oriented to person, place, and time.     Cranial Nerves: No cranial nerve deficit.  Psychiatric:        Behavior: Behavior normal.     Results for orders placed or performed in visit on 02/26/20  Microscopic Examination   URINE  Result Value Ref Range   WBC, UA 0-5 0 - 5 /hpf   RBC 3-10 (A) 0 - 2 /hpf   Epithelial Cells (non renal) 0-10 0 - 10 /hpf   Casts Present None seen /lpf   Cast Type Hyaline casts N/A   Bacteria, UA None seen None seen/Few  Urine Culture, Reflex   URINE  Result Value Ref Range   Urine Culture, Routine WILL FOLLOW   CBC with Differential/Platelet  Result Value Ref Range   WBC 6.0 3.4 - 10.8 x10E3/uL   RBC 4.86 3.77 - 5.28 x10E6/uL   Hemoglobin 14.5 11.1 - 15.9 g/dL   Hematocrit 44.0 34.0 - 46.6 %   MCV 91 79 - 97 fL   MCH 29.8 26.6 - 33.0 pg   MCHC 33.0 31.5 - 35.7 g/dL   RDW 12.3 11.7 - 15.4 %   Platelets 301 150 - 450 x10E3/uL   Neutrophils 60 Not Estab. %   Lymphs 24 Not Estab. %   Monocytes 8 Not Estab. %   Eos 7 Not Estab. %   Basos 1 Not  Estab. %   Neutrophils Absolute 3.6 1.4 - 7.0 x10E3/uL   Lymphocytes Absolute 1.4 0.7 - 3.1 x10E3/uL   Monocytes Absolute 0.5 0.1 - 0.9 x10E3/uL   EOS (ABSOLUTE) 0.4 0.0 - 0.4 x10E3/uL   Basophils Absolute 0.0 0.0 - 0.2 x10E3/uL   Immature Granulocytes 0 Not Estab. %   Immature Grans (Abs) 0.0 0.0 - 0.1 x10E3/uL  Comprehensive metabolic panel  Result Value Ref Range   Glucose 84 65 - 99 mg/dL   BUN 13 8 - 27 mg/dL   Creatinine, Ser 0.67 0.57 - 1.00 mg/dL   GFR calc non Af Amer 88 >59 mL/min/1.73   GFR calc Af Amer 102 >59 mL/min/1.73   BUN/Creatinine Ratio 19 12 - 28   Sodium 139 134 - 144 mmol/L   Potassium 4.2 3.5 - 5.2 mmol/L   Chloride 101 96 - 106 mmol/L   CO2 23 20 - 29 mmol/L   Calcium 9.5 8.7 - 10.3 mg/dL   Total Protein 6.6 6.0 - 8.5  g/dL   Albumin 4.6 3.7 - 4.7 g/dL   Globulin, Total 2.0 1.5 - 4.5 g/dL   Albumin/Globulin Ratio 2.3 (H) 1.2 - 2.2   Bilirubin Total 0.6 0.0 - 1.2 mg/dL   Alkaline Phosphatase 66 39 - 117 IU/L   AST 16 0 - 40 IU/L   ALT 11 0 - 32 IU/L  TSH  Result Value Ref Range   TSH 1.600 0.450 - 4.500 uIU/mL  UA/M w/rflx Culture, Routine   Specimen: Urine   URINE  Result Value Ref Range   Specific Gravity, UA 1.020 1.005 - 1.030   pH, UA 7.0 5.0 - 7.5   Color, UA Yellow Yellow   Appearance Ur Clear Clear   Leukocytes,UA Trace (A) Negative   Protein,UA Negative Negative/Trace   Glucose, UA Negative Negative   Ketones, UA Negative Negative   RBC, UA 1+ (A) Negative   Bilirubin, UA Negative Negative   Urobilinogen, Ur 0.2 0.2 - 1.0 mg/dL   Nitrite, UA Negative Negative   Microscopic Examination See below:    Urinalysis Reflex Comment   Lipid Panel w/o Chol/HDL Ratio  Result Value Ref Range   Cholesterol, Total 178 100 - 199 mg/dL   Triglycerides 91 0 - 149 mg/dL   HDL 57 >39 mg/dL   VLDL Cholesterol Cal 17 5 - 40 mg/dL   LDL Chol Calc (NIH) 104 (H) 0 - 99 mg/dL      Assessment & Plan:   Problem List Items Addressed This Visit      Cardiovascular and Mediastinum   Hypertension - Primary    Stable and WNL, continue current regimen      Relevant Orders   CBC with Differential/Platelet (Completed)   Comprehensive metabolic panel (Completed)   TSH (Completed)   UA/M w/rflx Culture, Routine (Completed)   Atrial fibrillation (H[facility])    Stable, followed by Cardiology. Rate well controlled without significant sxs. Continue metoprolol and eliquis regimen        Respiratory   Allergic rhinitis    Stable and well controlled on prn OTC regimen. Continue current regimen        Musculoskeletal and Integument   Osteoporosis    UTD on DXA, continue calcium and vit D supplementation and weight bearing exercise        Other   Depression    Chronic, stable and well controlled.  Continue current regimen      History of breast cancer    Stable without  new changes or concerns. Await mammogram, continue home breast exams       Other Visit Diagnoses    Annual physical exam       Elevated LDL cholesterol level       Relevant Orders   Lipid Panel w/o Chol/HDL Ratio (Completed)       Follow up plan: Return in about 6 months (around 08/28/2020) for 6 month f/u.   LABORATORY TESTING:  - Pap smear: not applicable  IMMUNIZATIONS:   - Tdap: Tetanus vaccination status reviewed: last tetanus booster within 10 years. - Influenza: Up to date - Pneumovax: Up to date - Prevnar: Up to date - HPV: Not applicable - Zostavax vaccine: Up to date  SCREENING: -Mammogram: Ordered today  - Colonoscopy: Up to date  - Bone Density: Up to date   PATIENT COUNSELING:   Advised to take 1 mg of folate supplement per day if capable of pregnancy.   Sexuality: Discussed sexually transmitted diseases, partner selection, use of condoms, avoidance of unintended pregnancy  and contraceptive alternatives.   Advised to avoid cigarette smoking.  I discussed with the patient that most people either abstain from alcohol or drink within safe limits (<=14/week and <=4 drinks/occasion for males, <=7/weeks and <= 3 drinks/occasion for females) and that the risk for alcohol disorders and other health effects rises proportionally with the number of drinks per week and how often a drinker exceeds daily limits.  Discussed cessation/primary prevention of drug use and availability of treatment for abuse.   Diet: Encouraged to adjust caloric intake to maintain  or achieve ideal body weight, to reduce intake of dietary saturated fat and total fat, to limit sodium intake by avoiding high sodium foods and not adding table salt, and to maintain adequate dietary potassium and calcium preferably from fresh fruits, vegetables, and low-fat dairy products.    stressed the importance of regular  exercise  Injury prevention: Discussed safety belts, safety helmets, smoke detector, smoking near bedding or upholstery.   Dental health: Discussed importance of regular tooth brushing, flossing, and dental visits.    NEXT PREVENTATIVE PHYSICAL DUE IN 1 YEAR. Return in about 6 months (around 08/28/2020) for 6 month f/u.

## 2020-02-28 NOTE — Assessment & Plan Note (Signed)
Stable and WNL, continue current regimen 

## 2020-02-28 NOTE — Assessment & Plan Note (Signed)
Stable, followed by Cardiology. Rate well controlled without significant sxs. Continue metoprolol and eliquis regimen

## 2020-02-28 NOTE — Assessment & Plan Note (Signed)
Stable and well controlled on prn OTC regimen. Continue current regimen

## 2020-02-28 NOTE — Assessment & Plan Note (Signed)
Stable without new changes or concerns. Await mammogram, continue home breast exams

## 2020-02-28 NOTE — Assessment & Plan Note (Signed)
UTD on DXA, continue calcium and vit D supplementation and weight bearing exercise

## 2020-03-08 ENCOUNTER — Ambulatory Visit: Payer: Medicare Other | Attending: Internal Medicine

## 2020-03-08 DIAGNOSIS — Z23 Encounter for immunization: Secondary | ICD-10-CM

## 2020-03-08 NOTE — Progress Notes (Signed)
   Covid-19 Vaccination Clinic  Name:  Melanie Wallace    MRN: [id number] DOB: 1946-04-21  03/08/2020  [name] was observed post Covid-19 immunization for 15 minutes without incident. She was provided with Vaccine Information Sheet and instruction to access the V-Safe system.   [name] was instructed to call 911 with any severe reactions post vaccine: Marland Kitchen Difficulty breathing  . Swelling of face and throat  . A fast heartbeat  . A bad rash all over body  . Dizziness and weakness   Immunizations Administered    Name Date Dose VIS Date Route   Pfizer COVID-19 Vaccine 03/08/2020  9:02 AM 0.3 mL 11/27/2019 Intramuscular   Manufacturer: [place]   Lot: [id number]   [place]: [id number]

## 2020-03-17 DIAGNOSIS — I340 Nonrheumatic mitral (valve) insufficiency: Secondary | ICD-10-CM | POA: Diagnosis not present

## 2020-03-17 DIAGNOSIS — I480 Paroxysmal atrial fibrillation: Secondary | ICD-10-CM | POA: Diagnosis not present

## 2020-03-17 DIAGNOSIS — I361 Nonrheumatic tricuspid (valve) insufficiency: Secondary | ICD-10-CM | POA: Diagnosis not present

## 2020-03-23 ENCOUNTER — Encounter: Payer: Self-pay | Admitting: Family Medicine

## 2020-03-23 ENCOUNTER — Other Ambulatory Visit: Payer: Self-pay

## 2020-03-23 ENCOUNTER — Ambulatory Visit (INDEPENDENT_AMBULATORY_CARE_PROVIDER_SITE_OTHER): Payer: Medicare Other | Admitting: Family Medicine

## 2020-03-23 VITALS — BP 124/77 | HR 72 | Temp 98.2°F | Wt 162.0 lb

## 2020-03-23 DIAGNOSIS — K219 Gastro-esophageal reflux disease without esophagitis: Secondary | ICD-10-CM

## 2020-03-23 MED ORDER — PANTOPRAZOLE SODIUM 40 MG PO TBEC: 40.0000 mg | DELAYED_RELEASE_TABLET | Freq: Every day | ORAL | 1 refills | Status: DC

## 2020-03-23 NOTE — Progress Notes (Signed)
BP 124/77   Pulse 72   Temp 98.2 F (36.8 C) (Oral)   Wt 162 lb (73.5 kg)   LMP 01/03/1999 (Approximate)   SpO2 97%   BMI 26.55 kg/m    Subjective:    Patient ID: Melanie Wallace, female    DOB: [birth year], 74 y.o.   MRN: [id number]  HPI: Melanie Wallace is a 74 y.o. female  Chief Complaint  Patient presents with  . Stomach problems    pt states that she has been having some belching/burping and gas through her mouth x about 2 weeks, mostly every day all the time. pt states she eating well, no pain   Bloating, belching the past 2 or so weeks. Tried some diet changes, pepcid twice daily but the sxs got worse. Denies abdominal pain, bowel changes, burning in throat, melena. Had an issue with reflux about 10 years ago but nothing since. Had an EGD at that time that was normal.    Relevant past medical, surgical, family and social history reviewed and updated as indicated. Interim medical history since our last visit reviewed. Allergies and medications reviewed and updated.  Review of Systems  Per HPI unless specifically indicated above     Objective:    BP 124/77   Pulse 72   Temp 98.2 F (36.8 C) (Oral)   Wt 162 lb (73.5 kg)   LMP 01/03/1999 (Approximate)   SpO2 97%   BMI 26.55 kg/m   Wt Readings from Last 3 Encounters:  03/23/20 162 lb (73.5 kg)  02/26/20 165 lb (74.8 kg)  08/26/19 160 lb (72.6 kg)    Physical Exam Vitals and nursing note reviewed.  Constitutional:      Appearance: Normal appearance. She is not ill-appearing.  HENT:     Head: Atraumatic.  Eyes:     Extraocular Movements: Extraocular movements intact.     Conjunctiva/sclera: Conjunctivae normal.  Cardiovascular:     Rate and Rhythm: Normal rate and regular rhythm.     Heart sounds: Normal heart sounds.  Pulmonary:     Effort: Pulmonary effort is normal.     Breath sounds: Normal breath sounds.  Abdominal:     General: Bowel sounds are normal. There is no distension.     Palpations:  Abdomen is soft.     Tenderness: There is no abdominal tenderness. There is no right CVA tenderness, left CVA tenderness or guarding.  Musculoskeletal:        General: Normal range of motion.     Cervical back: Normal range of motion and neck supple.  Skin:    General: Skin is warm and dry.  Neurological:     Mental Status: She is alert and oriented to person, place, and time.  Psychiatric:        Mood and Affect: Mood normal.        Thought Content: Thought content normal.        Judgment: Judgment normal.     Results for orders placed or performed in visit on 02/26/20  HM COLONOSCOPY  Result Value Ref Range   HM Colonoscopy See Report (in chart) See Report (in chart), Patient Reported      Assessment & Plan:   Problem List Items Addressed This Visit    None    Visit Diagnoses    Gastroesophageal reflux disease, unspecified whether esophagitis present    -  Primary   Trial protonix, BRAT diet, TUMS prn. Will check for H pylori and further causes  if not relieved with 4-6 weeks of PPI   Relevant Medications   pantoprazole (PROTONIX) 40 MG tablet       Follow up plan: Return for as scheduled.

## 2020-04-09 ENCOUNTER — Other Ambulatory Visit: Payer: Self-pay | Admitting: Family Medicine

## 2020-04-09 NOTE — Telephone Encounter (Signed)
Requested Prescriptions  Pending Prescriptions Disp Refills  . citalopram (CELEXA) 40 MG tablet [Pharmacy Med Name: CITALOPRAM HBR 40 MG TABLET] 90 tablet 1    Sig: TAKE 1 TABLET BY MOUTH EVERYDAY AT BEDTIME     Psychiatry:  Antidepressants - SSRI Passed - 04/09/2020  9:48 AM      Passed - Completed PHQ-2 or PHQ-9 in the last 360 days.      Passed - Valid encounter within last 6 months    Recent Outpatient Visits          2 weeks ago Gastroesophageal reflux disease, unspecified whether esophagitis present   [facility] [place], [place]   1 month ago Essential hypertension   [facility] [place], [place]   7 months ago Essential hypertension   [facility] [place], [place]   1 year ago Annual physical exam   [facility] [place], [place]   1 year ago Essential hypertension   [facility] [place], [place]      Future Appointments            In 4 months Orene Desanctis, Lilia Argue, [place], [place]

## 2020-04-14 ENCOUNTER — Other Ambulatory Visit: Payer: Self-pay | Admitting: Family Medicine

## 2020-04-18 DIAGNOSIS — H3530 Unspecified macular degeneration: Secondary | ICD-10-CM | POA: Diagnosis not present

## 2020-04-28 ENCOUNTER — Encounter: Payer: Self-pay | Admitting: Family Medicine

## 2020-04-28 ENCOUNTER — Other Ambulatory Visit: Payer: Self-pay

## 2020-04-28 ENCOUNTER — Ambulatory Visit (INDEPENDENT_AMBULATORY_CARE_PROVIDER_SITE_OTHER): Payer: Medicare Other | Admitting: Family Medicine

## 2020-04-28 VITALS — BP 130/76 | HR 66 | Temp 97.7°F | Ht 66.69 in | Wt 164.5 lb

## 2020-04-28 DIAGNOSIS — K219 Gastro-esophageal reflux disease without esophagitis: Secondary | ICD-10-CM

## 2020-04-28 NOTE — Progress Notes (Signed)
BP 130/76   Pulse 66   Temp 97.7 F (36.5 C)   Ht 5' 6.69" (1.694 m)   Wt 164 lb 8 oz (74.6 kg)   LMP 01/03/1999 (Approximate)   SpO2 97%   BMI 26.00 kg/m    Subjective:    Patient ID: Melanie Wallace, female    DOB: [birth year], 74 y.o.   MRN: [id number]  HPI: Melanie Wallace is a 74 y.o. female  Chief Complaint  Patient presents with  . Gastroesophageal Reflux    Patient finished the month of Protonix   Here today for GERD f/u after 1 month on protonix. Noticed no benefit on the medication. Mostly dealing with belching, bloating in upper abdomen. Denies abdominal pain, brash, N/V, bowel changes, fever, melena. Had EGD about 10 years ago for similar sxs which was benign and sxs at that time dissipated spontaneously. Requesting referral back to [facility] for repeat workup for peace of mind.   Relevant past medical, surgical, family and social history reviewed and updated as indicated. Interim medical history since our last visit reviewed. Allergies and medications reviewed and updated.  Review of Systems  Per HPI unless specifically indicated above     Objective:    BP 130/76   Pulse 66   Temp 97.7 F (36.5 C)   Ht 5' 6.69" (1.694 m)   Wt 164 lb 8 oz (74.6 kg)   LMP 01/03/1999 (Approximate)   SpO2 97%   BMI 26.00 kg/m   Wt Readings from Last 3 Encounters:  04/28/20 164 lb 8 oz (74.6 kg)  03/23/20 162 lb (73.5 kg)  02/26/20 165 lb (74.8 kg)    Physical Exam Vitals and nursing note reviewed.  Constitutional:      Appearance: Normal appearance. She is not ill-appearing.  HENT:     Head: Atraumatic.  Eyes:     Extraocular Movements: Extraocular movements intact.     Conjunctiva/sclera: Conjunctivae normal.  Cardiovascular:     Rate and Rhythm: Normal rate and regular rhythm.     Heart sounds: Normal heart sounds.  Pulmonary:     Effort: Pulmonary effort is normal.     Breath sounds: Normal breath sounds.  Abdominal:     General: Bowel sounds are normal.  There is no distension.     Palpations: Abdomen is soft.     Tenderness: There is no abdominal tenderness. There is no right CVA tenderness, left CVA tenderness or guarding.  Musculoskeletal:        General: Normal range of motion.     Cervical back: Normal range of motion and neck supple.  Skin:    General: Skin is warm and dry.  Neurological:     Mental Status: She is alert and oriented to person, place, and time.  Psychiatric:        Mood and Affect: Mood normal.        Thought Content: Thought content normal.        Judgment: Judgment normal.     Results for orders placed or performed in visit on 02/26/20  HM COLONOSCOPY  Result Value Ref Range   HM Colonoscopy See Report (in chart) See Report (in chart), Patient Reported      Assessment & Plan:   Problem List Items Addressed This Visit      Digestive   Gastroesophageal reflux disease - Primary    No benefit with PPI regimen, may continue if desired and continue diet modifications. Refer back to GI per  patient request for peace of mind. Return precautions reviewed for worsening sxs      Relevant Orders   Ambulatory referral to Gastroenterology       Follow up plan: Return for as scheduled.

## 2020-05-04 DIAGNOSIS — K219 Gastro-esophageal reflux disease without esophagitis: Secondary | ICD-10-CM | POA: Insufficient documentation

## 2020-05-04 NOTE — Assessment & Plan Note (Signed)
No benefit with PPI regimen, may continue if desired and continue diet modifications. Refer back to GI per patient request for peace of mind. Return precautions reviewed for worsening sxs

## 2020-05-18 DIAGNOSIS — K219 Gastro-esophageal reflux disease without esophagitis: Secondary | ICD-10-CM | POA: Diagnosis not present

## 2020-08-18 ENCOUNTER — Other Ambulatory Visit: Payer: Self-pay | Admitting: Family Medicine

## 2020-08-26 ENCOUNTER — Encounter: Payer: Self-pay | Admitting: Nurse Practitioner

## 2020-08-29 ENCOUNTER — Ambulatory Visit (INDEPENDENT_AMBULATORY_CARE_PROVIDER_SITE_OTHER): Payer: Medicare Other | Admitting: Nurse Practitioner

## 2020-08-29 ENCOUNTER — Encounter: Payer: Self-pay | Admitting: Nurse Practitioner

## 2020-08-29 ENCOUNTER — Ambulatory Visit: Payer: Medicare Other | Admitting: Family Medicine

## 2020-08-29 ENCOUNTER — Other Ambulatory Visit: Payer: Self-pay

## 2020-08-29 VITALS — BP 122/74 | HR 67 | Temp 98.5°F | Resp 16 | Ht 67.0 in | Wt 158.6 lb

## 2020-08-29 DIAGNOSIS — E7800 Pure hypercholesterolemia, unspecified: Secondary | ICD-10-CM

## 2020-08-29 DIAGNOSIS — Z853 Personal history of malignant neoplasm of breast: Secondary | ICD-10-CM

## 2020-08-29 DIAGNOSIS — Z1231 Encounter for screening mammogram for malignant neoplasm of breast: Secondary | ICD-10-CM

## 2020-08-29 DIAGNOSIS — E782 Mixed hyperlipidemia: Secondary | ICD-10-CM | POA: Insufficient documentation

## 2020-08-29 DIAGNOSIS — Z23 Encounter for immunization: Secondary | ICD-10-CM

## 2020-08-29 DIAGNOSIS — F325 Major depressive disorder, single episode, in full remission: Secondary | ICD-10-CM | POA: Diagnosis not present

## 2020-08-29 DIAGNOSIS — I480 Paroxysmal atrial fibrillation: Secondary | ICD-10-CM

## 2020-08-29 DIAGNOSIS — I10 Essential (primary) hypertension: Secondary | ICD-10-CM | POA: Diagnosis not present

## 2020-08-29 MED ORDER — CITALOPRAM HYDROBROMIDE 20 MG PO TABS: 20.0000 mg | ORAL_TABLET | Freq: Every day | ORAL | 4 refills | Status: DC

## 2020-08-29 NOTE — Assessment & Plan Note (Signed)
Chronic, rate controlled.  Continue collaboration with cardiology and current medication regimen as prescribed by them.  Recent note and echo report reviewed.  BMP today. °

## 2020-08-29 NOTE — Assessment & Plan Note (Signed)
Chronic, stable.  Denies SI/HI.  At length discussion with patient on Celexa, will reduce dose to 20 MG daily due to her age >89 and risk for QT prolongation with 40 MG dosing in her age group.  She agrees with this plan.  Script sent in.  Follow-up in 6 months.

## 2020-08-29 NOTE — Assessment & Plan Note (Signed)
Chronic, stable with BP at goal for age today.  Followed by cardiology.  Will continue current medication regimen and adjust as needed.  Recommend she continue to monitor BP and HR at home daily + document and bring these to office visits.  Focus on DASH diet.  BMP today.  Return to office in 6 months.

## 2020-08-29 NOTE — Addendum Note (Signed)
Addended by: Shawna Orleans on: 08/29/2020 11:50 AM   Modules accepted: Orders

## 2020-08-29 NOTE — Assessment & Plan Note (Signed)
Mammogram order placed

## 2020-08-29 NOTE — Patient Instructions (Signed)

## 2020-08-29 NOTE — Progress Notes (Signed)
BP 122/74 (BP Location: Left Arm, Patient Position: Sitting, Cuff Size: Normal)   Pulse 67   Temp 98.5 F (36.9 C) (Oral)   Resp 16   Ht 5\' 7"  (1.702 m)   Wt 158 lb 9.6 oz (71.9 kg)   LMP 01/03/1999 (Approximate)   SpO2 97%   BMI 24.84 kg/m    Subjective:    Patient ID: Melanie Wallace, female    DOB: [birth year], 74 y.o.   MRN: [id number]  HPI: Melanie Wallace is a 74 y.o. female  Chief Complaint  Patient presents with  . Hypertension  . Depression   HYPERTENSION Continues on Losartan-HCTZ 50-12.5 MG daily + Metoprolol 25 MG daily and Apixaban 5 MG BID -- for atrial fibrillation.  No current statin and last LDL 104, is going to discuss with cardiology.   She last saw cardiology at [facility], Dr. Sabra Heck, on 02/18/20.  Recent echo on 03/17/20 with EF >55% and mild to moderate mitral valve regurgitation. Hypertension status: controlled  Satisfied with current treatment? yes Duration of hypertension: chronic BP monitoring frequency:  a few times a week BP range: 120-130/70 at home BP medication side effects:  no Medication compliance: good compliance Aspirin: no Recurrent headaches: no Visual changes: no Palpitations: no Dyspnea: no Chest pain: no Lower extremity edema: no Dizzy/lightheaded: no  The 10-year ASCVD risk score Mikey Bussing DC Jr., et al., 2013) is: 13.8%   Values used to calculate the score:     Age: 53 years     Sex: Female     Is Non-Hispanic African American: No     Diabetic: No     Tobacco smoker: No     Systolic Blood Pressure: 245 mmHg     Is BP treated: Yes     HDL Cholesterol: 57 mg/dL     Total Cholesterol: 178 mg/dL  DEPRESSION Continues on Celexa 40 MG daily, educated her on this medication and that current dosing no the recommended dosing for her age, need to reduce to 20 MG. Mood status: controlled Satisfied with current treatment?: yes Symptom severity: mild  Duration of current treatment : chronic Side effects: no Medication compliance: good  compliance Psychotherapy/counseling: none Previous psychiatric medications: none Depressed mood: yes Anxious mood: no Anhedonia: no Significant weight loss or gain: no Insomnia: none Fatigue: no Feelings of worthlessness or guilt: no Impaired concentration/indecisiveness: no Suicidal ideations: no Hopelessness: no Crying spells: no Depression screen [facility] 2/9 08/29/2020 02/26/2020 08/26/2019 02/18/2019 08/11/2018  Decreased Interest 0 0 0 0 0  Down, Depressed, Hopeless 0 0 0 0 0  PHQ - 2 Score 0 0 0 0 0  Altered sleeping 0 0 0 0 0  Tired, decreased energy 0 0 0 0 0  Change in appetite 0 0 0 0 0  Feeling bad or failure about yourself  0 0 0 0 0  Trouble concentrating 0 0 0 0 0  Moving slowly or fidgety/restless 0 0 0 0 0  Suicidal thoughts 0 0 0 0 0  PHQ-9 Score 0 0 0 0 0  Difficult doing work/chores Not difficult at all - Not difficult at all - -  Some recent data might be hidden     Relevant past medical, surgical, family and social history reviewed and updated as indicated. Interim medical history since our last visit reviewed. Allergies and medications reviewed and updated.  Review of Systems  Constitutional: Negative for activity change, appetite change, diaphoresis, fatigue and fever.  Respiratory: Negative for cough, chest tightness,  shortness of breath and wheezing.   Cardiovascular: Negative for chest pain, palpitations and leg swelling.  Gastrointestinal: Negative.   Neurological: Negative.   Psychiatric/Behavioral: Negative.     Per HPI unless specifically indicated above     Objective:    BP 122/74 (BP Location: Left Arm, Patient Position: Sitting, Cuff Size: Normal)   Pulse 67   Temp 98.5 F (36.9 C) (Oral)   Resp 16   Ht 5\' 7"  (1.702 m)   Wt 158 lb 9.6 oz (71.9 kg)   LMP 01/03/1999 (Approximate)   SpO2 97%   BMI 24.84 kg/m   Wt Readings from Last 3 Encounters:  08/29/20 158 lb 9.6 oz (71.9 kg)  04/28/20 164 lb 8 oz (74.6 kg)  03/23/20 162 lb (73.5 kg)     Physical Exam Vitals and nursing note reviewed.  Constitutional:      General: She is awake. She is not in acute distress.    Appearance: She is well-developed and well-groomed. She is not ill-appearing.  HENT:     Head: Normocephalic.     Right Ear: Hearing normal.     Left Ear: Hearing normal.  Eyes:     General: Lids are normal.        Right eye: No discharge.        Left eye: No discharge.     Conjunctiva/sclera: Conjunctivae normal.     Pupils: Pupils are equal, round, and reactive to light.  Neck:     Thyroid: No thyromegaly.     Vascular: No carotid bruit.  Cardiovascular:     Rate and Rhythm: Normal rate and regular rhythm.     Heart sounds: Normal heart sounds. No murmur heard.  No gallop.   Pulmonary:     Effort: Pulmonary effort is normal. No accessory muscle usage or respiratory distress.     Breath sounds: Normal breath sounds.  Abdominal:     General: Bowel sounds are normal.     Palpations: Abdomen is soft.  Musculoskeletal:     Cervical back: Normal range of motion and neck supple.     Right lower leg: No edema.     Left lower leg: No edema.  Lymphadenopathy:     Cervical: No cervical adenopathy.  Skin:    General: Skin is warm and dry.  Neurological:     Mental Status: She is alert and oriented to person, place, and time.  Psychiatric:        Attention and Perception: Attention normal.        Mood and Affect: Mood normal.        Speech: Speech normal.        Behavior: Behavior normal. Behavior is cooperative.        Thought Content: Thought content normal.    Results for orders placed or performed in visit on 02/26/20  HM COLONOSCOPY  Result Value Ref Range   HM Colonoscopy See Report (in chart) See Report (in chart), Patient Reported      Assessment & Plan:   Problem List Items Addressed This Visit      Cardiovascular and Mediastinum   Hypertension    Chronic, stable with BP at goal for age today.  Followed by cardiology.  Will continue  current medication regimen and adjust as needed.  Recommend she continue to monitor BP and HR at home daily + document and bring these to office visits.  Focus on DASH diet.  BMP today.  Return to office in 6 months.  Relevant Orders   Basic metabolic panel   Lipid Panel w/o Chol/HDL Ratio   Atrial fibrillation (HCC) - Primary    Chronic, rate controlled.  Continue collaboration with cardiology and current medication regimen as prescribed by them.  Recent note and echo report reviewed.  BMP today.        Other   Depression    Chronic, stable.  Denies SI/HI.  At length discussion with patient on Celexa, will reduce dose to 20 MG daily due to her age >58 and risk for QT prolongation with 40 MG dosing in her age group.  She agrees with this plan.  Script sent in.  Follow-up in 6 months.      Relevant Medications   citalopram (CELEXA) 20 MG tablet   History of breast cancer    Mammogram order placed.      Relevant Orders   MM DIGITAL SCREENING BILATERAL   Elevated LDL cholesterol level    LDL recent labs 104 with ASCVD 13.8%.  Discussed with patient that statin may offer benefit, may also benefit from coronary artery calcium scoring to further define risk.  She wishes to discuss with cardiology, which is appropriate.  Has upcoming visit and will reviewed recommendations at that time.       Other Visit Diagnoses    Encounter for screening mammogram for malignant neoplasm of breast       Relevant Orders   MM DIGITAL SCREENING BILATERAL       Follow up plan: Return in about 6 months (around 02/26/2021) for HTN, elevated LDL, A-fib, MOOD.

## 2020-08-29 NOTE — Assessment & Plan Note (Signed)
LDL recent labs 104 with ASCVD 13.8%.  Discussed with patient that statin may offer benefit, may also benefit from coronary artery calcium scoring to further define risk.  She wishes to discuss with cardiology, which is appropriate.  Has upcoming visit and will reviewed recommendations at that time.

## 2020-08-30 LAB — LIPID PANEL W/O CHOL/HDL RATIO
Cholesterol, Total: 187 mg/dL (ref 100–199)
HDL: 51 mg/dL (ref >39)
LDL Chol Calc (NIH): 117 mg/dL — ABNORMAL HIGH (ref 0–99)
Triglycerides: 104 mg/dL (ref 0–149)
VLDL Cholesterol Cal: 19 mg/dL (ref 5–40)

## 2020-08-30 LAB — BASIC METABOLIC PANEL
BUN/Creatinine Ratio: 27 (ref 12–28)
BUN: 15 mg/dL (ref 8–27)
CO2: 20 mmol/L (ref 20–29)
Calcium: 9.5 mg/dL (ref 8.7–10.3)
Chloride: 100 mmol/L (ref 96–106)
Creatinine, Ser: 0.55 mg/dL — ABNORMAL LOW (ref 0.57–1.00)
GFR calc Af Amer: 108 mL/min/{1.73_m2} (ref >59)
GFR calc non Af Amer: 94 mL/min/{1.73_m2} (ref >59)
Glucose: 56 mg/dL — ABNORMAL LOW (ref 65–99)
Potassium: 3.9 mmol/L (ref 3.5–5.2)
Sodium: 140 mmol/L (ref 134–144)

## 2020-08-30 NOTE — Progress Notes (Signed)
Please let Alli know her labs have returned.  Kidney function and electrolytes look good.  Cholesterol levels continue to show elevation in LDL, bad cholesterol, I would recommend she discuss this with her heart doctor as we discussed and if any questions let me know.  Have a great day!!

## 2020-09-09 ENCOUNTER — Other Ambulatory Visit: Payer: Self-pay | Admitting: Family Medicine

## 2020-09-09 NOTE — Telephone Encounter (Signed)
Has appointment in December

## 2020-10-03 DIAGNOSIS — Z1231 Encounter for screening mammogram for malignant neoplasm of breast: Secondary | ICD-10-CM | POA: Diagnosis not present

## 2020-10-03 LAB — HM MAMMOGRAPHY

## 2020-10-07 ENCOUNTER — Other Ambulatory Visit: Payer: Self-pay | Admitting: Family Medicine

## 2020-10-14 ENCOUNTER — Ambulatory Visit (INDEPENDENT_AMBULATORY_CARE_PROVIDER_SITE_OTHER): Payer: Medicare Other | Admitting: Nurse Practitioner

## 2020-10-14 ENCOUNTER — Encounter: Payer: Self-pay | Admitting: Nurse Practitioner

## 2020-10-14 ENCOUNTER — Other Ambulatory Visit: Payer: Self-pay

## 2020-10-14 VITALS — BP 116/76 | HR 65 | Temp 97.7°F | Resp 16 | Wt 157.0 lb

## 2020-10-14 DIAGNOSIS — H6122 Impacted cerumen, left ear: Secondary | ICD-10-CM

## 2020-10-14 DIAGNOSIS — H9312 Tinnitus, left ear: Secondary | ICD-10-CM

## 2020-10-14 NOTE — Progress Notes (Signed)
BP 116/76   Pulse 65   Temp 97.7 F (36.5 C) (Oral)   Resp 16   Wt 157 lb (71.2 kg)   LMP 01/03/1999 (Approximate)   BMI 24.59 kg/m    Subjective:    Patient ID: Melanie Wallace, female    DOB: July 17, 1946, 74 y.o.   MRN: [id number]  HPI: Melanie Wallace is a 74 y.o. female presenting with tinnitus.  Chief Complaint  Patient presents with  . Tinnitus    Patient comes in office today with complaints of ringing in her left ear for the past two weeks. Patient states that she had cleaned her ears prior to ringing beginning. Patient denies sinus symptoms, ear pain, drainage or fever   TINNITUS Duration: weeks - 2 Description of tinnitus: buzzing Pulsatile: no Tinnitus duration: continuous Episode frequency: continous Severity: severe Aggravating factors: nothing Alleviating factors: nothing Head injury: no Chronic exposure to loud noises: no Exposure to ototoxic medications: no Vertigo:no Hearing loss: yes in left ear Aural fullness: no Headache:no  TMJ syndrome symptoms: no Unsteady gait: no Postural instability: no Diplopia, dysarthria, dysphagia or weakness: no Anxietydepression: no  Allergies  Allergen Reactions  . Sulfa Antibiotics Hives  . Sulfasalazine Hives  . Penicillin G Benzathine   . Green Tea Leaf Ext Palpitations  . Pen-Vee K [Penicillin V] Rash   Outpatient Encounter Medications as of 10/14/2020  Medication Sig Note  . apixaban (ELIQUIS) 5 MG TABS tablet Take 5 mg by mouth 2 (two) times daily. 08/11/2018: Being filled by cardiologist  . atorvastatin (LIPITOR) 10 MG tablet Take 10 mg by mouth daily.   . calcium-vitamin D (OSCAL WITH D) 500-200 MG-UNIT per tablet Take 1 tablet by mouth.   . citalopram (CELEXA) 20 MG tablet Take 1 tablet (20 mg total) by mouth daily.   Marland Kitchen losartan-hydrochlorothiazide (HYZAAR) 50-12.5 MG tablet TAKE 1 TABLET BY MOUTH EVERY DAY   . metoprolol succinate (TOPROL-XL) 25 MG 24 hr tablet Take 25 mg by mouth daily.  08/11/2018: Being filled by Cardiologist  . Multiple Vitamins-Minerals (OCUVITE PO) Take by mouth daily.    No facility-administered encounter medications on file as of 10/14/2020.   Patient Active Problem List   Diagnosis Date Noted  . Impacted cerumen of left ear 10/14/2020  . Elevated LDL cholesterol level 08/29/2020  . Gastroesophageal reflux disease 05/04/2020  . Atrial fibrillation ([place]) 02/04/2017  . Osteopenia 07/14/2015  . History of cancer of vagina 07/14/2015  . Allergic rhinitis 07/14/2015  . Hypertension 07/14/2015  . Depression 07/14/2015  . History of breast cancer 04/13/2013  . Disease of sebaceous glands 02/18/2013  . Hemangioma of skin and subcutaneous tissue 02/18/2013  . Dyspareunia 04/11/2012   Past Medical History:  Diagnosis Date  . Allergic rhinitis   . Atrial fibrillation ([place])   . Breast CA ([place])   . Depression   . Hypertension   . Osteoporosis   . Vaginal cancer ([place])    Relevant past medical, surgical, family and social history reviewed and updated as indicated. Interim medical history since our last visit reviewed.  Review of Systems  Constitutional: Negative.  Negative for activity change, appetite change, chills, fatigue and fever.  HENT: Positive for tinnitus. Negative for congestion, dental problem, ear discharge, ear pain, postnasal drip, rhinorrhea, sinus pressure, sinus pain, sore throat and trouble swallowing.   Eyes: Negative.   Skin: Negative.   Neurological: Negative.  Negative for weakness and headaches.  Psychiatric/Behavioral: Negative.     Per HPI unless specifically  indicated above     Objective:    BP 116/76   Pulse 65   Temp 97.7 F (36.5 C) (Oral)   Resp 16   Wt 157 lb (71.2 kg)   LMP 01/03/1999 (Approximate)   BMI 24.59 kg/m   Wt Readings from Last 3 Encounters:  10/14/20 157 lb (71.2 kg)  08/29/20 158 lb 9.6 oz (71.9 kg)  04/28/20 164 lb 8 oz (74.6 kg)    Physical Exam Vitals and nursing note reviewed.    Constitutional:      General: She is not in acute distress.    Appearance: Normal appearance. She is not toxic-appearing.  HENT:     Head: Normocephalic and atraumatic.     Right Ear: Tympanic membrane, ear canal and external ear normal.     Left Ear: Ear canal and external ear normal. There is impacted cerumen.     Nose: Nose normal. No congestion.     Mouth/Throat:     Mouth: Mucous membranes are moist.     Pharynx: Oropharynx is clear.  Eyes:     General: No scleral icterus.    Extraocular Movements: Extraocular movements intact.  Skin:    General: Skin is warm and dry.     Coloration: Skin is not jaundiced or pale.     Findings: No erythema.  Neurological:     Mental Status: She is alert and oriented to person, place, and time.     Gait: Gait normal.  Psychiatric:        Mood and Affect: Mood normal.        Behavior: Behavior normal.        Thought Content: Thought content normal.        Judgment: Judgment normal.        Assessment & Plan:   Problem List Items Addressed This Visit      Nervous and Auditory   Impacted cerumen of left ear - Primary    Acute, ongoing.  This is likely the cause of the buzzing in her ear.  Ear irrigated today with water with very minimal cerumen relief.  Encouraged patient to use over-the-counter Debrox to help loosen wax and return to clinic in about 2 weeks for repeat ear irrigation.       Other Visit Diagnoses    Tinnitus of left ear           Follow up plan: Return in about 2 weeks (around 10/28/2020).

## 2020-10-14 NOTE — Assessment & Plan Note (Addendum)
Acute, ongoing.  This is likely the cause of the buzzing in her ear.  Ear irrigated today with water with very minimal cerumen relief.  Patient tolerated this well with no dizziness or pain.  Encouraged patient to use over-the-counter Debrox to help loosen wax and return to clinic in about 2 weeks for repeat ear irrigation.

## 2020-10-14 NOTE — Patient Instructions (Addendum)
Tinnitus Tinnitus refers to hearing a sound when there is no actual source for that sound. This is often described as ringing in the ears. However, people with this condition may hear a variety of noises, in one ear or in both ears. The sounds of tinnitus can be soft, loud, or somewhere in between. Tinnitus can last for a few seconds or can be constant for days. It may go away without treatment and come back at various times. When tinnitus is constant or happens often, it can lead to other problems, such as trouble sleeping and trouble concentrating. Almost everyone experiences tinnitus at some point. Tinnitus that is long-lasting (chronic) or comes back often (recurs) may require medical attention. What are the causes? The cause of tinnitus is often not known. In some cases, it can result from:  Exposure to loud noises from machinery, music, or other sources.  An object (foreign body) stuck in the ear.  Earwax buildup.  Drinking alcohol or caffeine.  Taking certain medicines.  Age-related hearing loss. It may also be caused by medical conditions such as:  Ear or sinus infections.  High blood pressure.  Heart diseases.  Anemia.  Allergies.  Meniere's disease.  Thyroid problems.  Tumors.  A weak, bulging blood vessel (aneurysm) near the ear. What are the signs or symptoms? The main symptom of tinnitus is hearing a sound when there is no source for that sound. It may sound like:  Buzzing.  Roaring.  Ringing.  Blowing air.  Hissing.  Whistling.  Sizzling.  Humming.  Running water.  A musical note.  Tapping. Symptoms may affect only one ear (unilateral) or both ears (bilateral). How is this diagnosed? Tinnitus is diagnosed based on your symptoms, your medical history, and a physical exam. Your health care provider may do a thorough hearing test (audiologic exam) if your tinnitus:  Is unilateral.  Causes hearing difficulties.  Lasts 6 months or  longer. You may work with a health care provider who specializes in hearing disorders (audiologist). You may be asked questions about your symptoms and how they affect your daily life. You may have other tests done, such as:  CT scan.  MRI.  An imaging test of how blood flows through your blood vessels (angiogram). How is this treated? Treating an underlying medical condition can sometimes make tinnitus go away. If your tinnitus continues, other treatments may include:  Medicines.  Therapy and counseling to help you manage the stress of living with tinnitus.  Sound generators to mask the tinnitus. These include: ? Tabletop sound machines that play relaxing sounds to help you fall asleep. ? Wearable devices that fit in your ear and play sounds or music. ? Acoustic neural stimulation. This involves using headphones to listen to music that contains an auditory signal. Over time, listening to this signal may change some pathways in your brain and make you less sensitive to tinnitus. This treatment is used for very severe cases when no other treatment is working.  Using hearing aids or cochlear implants if your tinnitus is related to hearing loss. Hearing aids are worn in the outer ear. Cochlear implants are surgically placed in the inner ear. Follow these instructions at home: Managing symptoms      When possible, avoid being in loud places and being exposed to loud sounds.  Wear hearing protection, such as earplugs, when you are exposed to loud noises.  Use a white noise machine, a humidifier, or other devices to mask the sound of tinnitus.    Practice techniques for reducing stress, such as meditation, yoga, or deep breathing. Work with your health care provider if you need help with managing stress.  Sleep with your head slightly raised. This may reduce the impact of tinnitus. General instructions  Do not use stimulants, such as nicotine, alcohol, or caffeine. Talk with your health  care provider about other stimulants to avoid. Stimulants are substances that can make you feel alert and attentive by increasing certain activities in the body (such as heart rate and blood pressure). These substances may make tinnitus worse.  Take over-the-counter and prescription medicines only as told by your health care provider.  Try to get plenty of sleep each night.  Keep all follow-up visits as told by your health care provider. This is important. Contact a health care provider if:  Your tinnitus continues for 3 weeks or longer without stopping.  You develop sudden hearing loss.  Your symptoms get worse or do not get better with home care.  You feel you are not able to manage the stress of living with tinnitus. Get help right away if:  You develop tinnitus after a head injury.  You have tinnitus along with any of the following: ? Dizziness. ? Loss of balance. ? Nausea and vomiting. ? Sudden, severe headache. These symptoms may represent a serious problem that is an emergency. Do not wait to see if the symptoms will go away. Get medical help right away. Call your local emergency services (911 in the [place] Do not drive yourself to the hospital. Summary  Tinnitus refers to hearing a sound when there is no actual source for that sound. This is often described as ringing in the ears.  Symptoms may affect only one ear (unilateral) or both ears (bilateral).  Use a white noise machine, a humidifier, or other devices to mask the sound of tinnitus.  Do not use stimulants, such as nicotine, alcohol, or caffeine. Talk with your health care provider about other stimulants to avoid. These substances may make tinnitus worse. This information is not intended to replace advice given to you by your health care provider. Make sure you discuss any questions you have with your health care provider. Document Revised: 06/17/2019 Document Reviewed: 09/12/2017 Elsevier Patient Education  2020  [place], Adult The ears produce a substance called earwax that helps keep bacteria out of the ear and protects the skin in the ear canal. Occasionally, earwax can build up in the ear and cause discomfort or hearing loss. What increases the risk? This condition is more likely to develop in people who:  Are female.  Are elderly.  Naturally produce more earwax.  Clean their ears often with cotton swabs.  Use earplugs often.  Use in-ear headphones often.  Wear hearing aids.  Have narrow ear canals.  Have earwax that is overly thick or sticky.  Have eczema.  Are dehydrated.  Have excess hair in the ear canal. What are the signs or symptoms? Symptoms of this condition include:  Reduced or muffled hearing.  A feeling of fullness in the ear or feeling that the ear is plugged.  Fluid coming from the ear.  Ear pain.  Ear itch.  Ringing in the ear.  Coughing.  An obvious piece of earwax that can be seen inside the ear canal. How is this diagnosed? This condition may be diagnosed based on:  Your symptoms.  Your medical history.  An ear exam. During the exam, your health care provider will look  into your ear with an instrument called an otoscope. You may have tests, including a hearing test. How is this treated? This condition may be treated by:  Using ear drops to soften the earwax.  Having the earwax removed by a health care provider. The health care provider may: ? Flush the ear with water. ? Use an instrument that has a loop on the end (curette). ? Use a suction device.  Surgery to remove the wax buildup. This may be done in severe cases. Follow these instructions at home:   Take over-the-counter and prescription medicines only as told by your health care provider.  Do not put any objects, including cotton swabs, into your ear. You can clean the opening of your ear canal with a washcloth or facial tissue.  Follow instructions from  your health care provider about cleaning your ears. Do not over-clean your ears.  Drink enough fluid to keep your urine clear or pale yellow. This will help to thin the earwax.  Keep all follow-up visits as told by your health care provider. If earwax builds up in your ears often or if you use hearing aids, consider seeing your health care provider for routine, preventive ear cleanings. Ask your health care provider how often you should schedule your cleanings.  If you have hearing aids, clean them according to instructions from the manufacturer and your health care provider. Contact a health care provider if:  You have ear pain.  You develop a fever.  You have blood, pus, or other fluid coming from your ear.  You have hearing loss.  You have ringing in your ears that does not go away.  Your symptoms do not improve with treatment.  You feel like the room is spinning (vertigo). Summary  Earwax can build up in the ear and cause discomfort or hearing loss.  The most common symptoms of this condition include reduced or muffled hearing and a feeling of fullness in the ear or feeling that the ear is plugged.  This condition may be diagnosed based on your symptoms, your medical history, and an ear exam.  This condition may be treated by using ear drops to soften the earwax or by having the earwax removed by a health care provider.  Do not put any objects, including cotton swabs, into your ear. You can clean the opening of your ear canal with a washcloth or facial tissue. This information is not intended to replace advice given to you by your health care provider. Make sure you discuss any questions you have with your health care provider. Document Revised: 11/15/2017 Document Reviewed: 02/13/2017 Elsevier Patient Education  2020 [employer].

## 2020-10-25 ENCOUNTER — Ambulatory Visit (INDEPENDENT_AMBULATORY_CARE_PROVIDER_SITE_OTHER): Payer: Medicare Other | Admitting: Nurse Practitioner

## 2020-10-25 ENCOUNTER — Other Ambulatory Visit: Payer: Self-pay

## 2020-10-25 ENCOUNTER — Encounter: Payer: Self-pay | Admitting: Nurse Practitioner

## 2020-10-25 VITALS — BP 107/66 | HR 78 | Temp 97.9°F | Wt 157.2 lb

## 2020-10-25 DIAGNOSIS — H9311 Tinnitus, right ear: Secondary | ICD-10-CM | POA: Diagnosis not present

## 2020-10-25 DIAGNOSIS — J3089 Other allergic rhinitis: Secondary | ICD-10-CM | POA: Diagnosis not present

## 2020-10-25 MED ORDER — CETIRIZINE HCL 5 MG PO TABS: 5.0000 mg | ORAL_TABLET | Freq: Every day | ORAL | 1 refills | Status: DC

## 2020-10-25 NOTE — Progress Notes (Signed)
BP 107/66   Pulse 78   Temp 97.9 F (36.6 C) (Oral)   Wt 157 lb 3.2 oz (71.3 kg)   LMP 01/03/1999 (Approximate)   SpO2 98%   BMI 24.62 kg/m    Subjective:    Patient ID: Melanie Wallace, female    DOB: [birth year], 74 y.o.   MRN: [id number]  HPI: Melanie Wallace is a 74 y.o. female presenting with tinnitus.  Chief Complaint  Patient presents with  . Tinnitus    pt states that the ringing in her L ear has gotten better but that her R ear is ringing now    TINNITUS Had deviated septum surgery less than 10 years ago; after this surgery, started having more "sniffles."  Recently, has been waking up with a lot of phlegm in her throat.  Ringing in left ear has completely resolved and reports a good amount of earwax came out using Debrox. Duration: days Description of tinnitus: buzzing Pulsatile: no Tinnitus duration: buzzing Episode frequency: continous Severity: mild Aggravating factors: nothing Alleviating factors: Debrox Head injury: no Chronic exposure to loud noises: no Exposure to ototoxic medications: no Vertigo:no Hearing loss: no Aural fullness: no Headache:no  TMJ syndrome symptoms: no Unsteady gait: no Postural instability: no Diplopia, dysarthria, dysphagia or weakness: no Anxietydepression: no  Allergies  Allergen Reactions  . Sulfa Antibiotics Hives  . Sulfasalazine Hives  . Penicillin G Benzathine   . Green Tea Leaf Ext Palpitations  . Pen-Vee K [Penicillin V] Rash   Outpatient Encounter Medications as of 10/25/2020  Medication Sig Note  . apixaban (ELIQUIS) 5 MG TABS tablet Take 5 mg by mouth 2 (two) times daily. 08/11/2018: Being filled by cardiologist  . atorvastatin (LIPITOR) 10 MG tablet Take 10 mg by mouth daily.   . calcium-vitamin D (OSCAL WITH D) 500-200 MG-UNIT per tablet Take 1 tablet by mouth.   . citalopram (CELEXA) 20 MG tablet Take 1 tablet (20 mg total) by mouth daily.   Marland Kitchen losartan-hydrochlorothiazide (HYZAAR) 50-12.5 MG tablet TAKE  1 TABLET BY MOUTH EVERY DAY   . metoprolol succinate (TOPROL-XL) 25 MG 24 hr tablet Take 25 mg by mouth daily. 08/11/2018: Being filled by Cardiologist  . Multiple Vitamins-Minerals (OCUVITE PO) Take by mouth daily.   . cetirizine (ZYRTEC) 5 MG tablet Take 1 tablet (5 mg total) by mouth daily.    No facility-administered encounter medications on file as of 10/25/2020.   Patient Active Problem List   Diagnosis Date Noted  . Impacted cerumen of left ear 10/14/2020  . Elevated LDL cholesterol level 08/29/2020  . Gastroesophageal reflux disease 05/04/2020  . Atrial fibrillation ([place]) 02/04/2017  . Osteopenia 07/14/2015  . History of cancer of vagina 07/14/2015  . Allergic rhinitis 07/14/2015  . Hypertension 07/14/2015  . Depression 07/14/2015  . History of breast cancer 04/13/2013  . Disease of sebaceous glands 02/18/2013  . Hemangioma of skin and subcutaneous tissue 02/18/2013  . Dyspareunia 04/11/2012   Past Medical History:  Diagnosis Date  . Allergic rhinitis   . Atrial fibrillation ([place])   . Breast CA ([place])   . Depression   . Hypertension   . Osteoporosis   . Vaginal cancer ([place])    Relevant past medical, surgical, family and social history reviewed and updated as indicated. Interim medical history since our last visit reviewed.  Review of Systems  Constitutional: Negative.  Negative for activity change, appetite change and fever.  HENT: Positive for rhinorrhea and tinnitus. Negative for congestion, dental problem,  drooling, ear discharge, ear pain, hearing loss, nosebleeds, postnasal drip, sinus pressure, sinus pain, sneezing, sore throat, trouble swallowing and voice change.   Skin: Negative.   Neurological: Negative.  Negative for dizziness, light-headedness and headaches.  Psychiatric/Behavioral: Negative.     Per HPI unless specifically indicated above     Objective:    BP 107/66   Pulse 78   Temp 97.9 F (36.6 C) (Oral)   Wt 157 lb 3.2 oz (71.3 kg)   LMP  01/03/1999 (Approximate)   SpO2 98%   BMI 24.62 kg/m   Wt Readings from Last 3 Encounters:  10/25/20 157 lb 3.2 oz (71.3 kg)  10/14/20 157 lb (71.2 kg)  08/29/20 158 lb 9.6 oz (71.9 kg)    Physical Exam Vitals and nursing note reviewed.  Constitutional:      General: She is not in acute distress.    Appearance: Normal appearance. She is not toxic-appearing.  HENT:     Head: Normocephalic and atraumatic.     Right Ear: Tympanic membrane, ear canal and external ear normal. No decreased hearing noted. No drainage, swelling or tenderness. There is no impacted cerumen. Tympanic membrane is not injected or bulging.     Left Ear: Tympanic membrane and external ear normal. There is no impacted cerumen. Tympanic membrane is not injected or bulging.     Ears:     Comments: Left canal slightly erythematous    Nose: Nose normal. No congestion.     Mouth/Throat:     Mouth: Mucous membranes are moist.     Pharynx: Oropharynx is clear.  Eyes:     General: No scleral icterus.    Extraocular Movements: Extraocular movements intact.  Skin:    General: Skin is warm and dry.     Coloration: Skin is not jaundiced or pale.     Findings: No erythema.  Neurological:     Mental Status: She is alert and oriented to person, place, and time.     Gait: Gait normal.  Psychiatric:        Mood and Affect: Mood normal.        Behavior: Behavior normal.        Thought Content: Thought content normal.        Judgment: Judgment normal.        Assessment & Plan:   Problem List Items Addressed This Visit      Respiratory   Allergic rhinitis    Chronic, ongoing.  Will start cetirizine and monitor for benefit.  Okay to continue to use Debrox a few times monthly as needed to help with earwax.  Follow-up as scheduled.       Other Visit Diagnoses    Tinnitus of right ear    -  Primary       Follow up plan: Return for As scheduled.

## 2020-10-25 NOTE — Assessment & Plan Note (Signed)
Chronic, ongoing.  Will start cetirizine and monitor for benefit.  Okay to continue to use Debrox a few times monthly as needed to help with earwax.  Follow-up as scheduled.

## 2020-10-25 NOTE — Patient Instructions (Signed)

## 2020-12-02 ENCOUNTER — Encounter: Payer: Self-pay | Admitting: Nurse Practitioner

## 2020-12-02 ENCOUNTER — Other Ambulatory Visit: Payer: Self-pay | Admitting: Nurse Practitioner

## 2020-12-02 DIAGNOSIS — D6869 Other thrombophilia: Secondary | ICD-10-CM | POA: Insufficient documentation

## 2020-12-05 ENCOUNTER — Ambulatory Visit (INDEPENDENT_AMBULATORY_CARE_PROVIDER_SITE_OTHER): Payer: Medicare Other | Admitting: Nurse Practitioner

## 2020-12-05 ENCOUNTER — Encounter: Payer: Self-pay | Admitting: Nurse Practitioner

## 2020-12-05 ENCOUNTER — Other Ambulatory Visit: Payer: Self-pay

## 2020-12-05 VITALS — BP 123/74 | HR 61 | Temp 97.4°F | Ht 66.69 in | Wt 163.6 lb

## 2020-12-05 DIAGNOSIS — D6869 Other thrombophilia: Secondary | ICD-10-CM

## 2020-12-05 DIAGNOSIS — I480 Paroxysmal atrial fibrillation: Secondary | ICD-10-CM

## 2020-12-05 DIAGNOSIS — K648 Other hemorrhoids: Secondary | ICD-10-CM | POA: Insufficient documentation

## 2020-12-05 DIAGNOSIS — E782 Mixed hyperlipidemia: Secondary | ICD-10-CM

## 2020-12-05 DIAGNOSIS — I10 Essential (primary) hypertension: Secondary | ICD-10-CM

## 2020-12-05 DIAGNOSIS — K625 Hemorrhage of anus and rectum: Secondary | ICD-10-CM | POA: Diagnosis not present

## 2020-12-05 MED ORDER — LOSARTAN POTASSIUM-HCTZ 50-12.5 MG PO TABS
1.0000 | ORAL_TABLET | Freq: Every day | ORAL | 4 refills | Status: DC
Start: 2020-12-05 — End: 2021-08-22

## 2020-12-05 NOTE — Assessment & Plan Note (Signed)
Chronic, rate controlled.  Continue collaboration with cardiology and current medication regimen as prescribed by them.  Recent note and echo report reviewed.  BMP today.

## 2020-12-05 NOTE — Patient Instructions (Signed)

## 2020-12-05 NOTE — Assessment & Plan Note (Signed)
With atrial fibrillation and Eliquis, check CBC today and monitor for bleeding. 

## 2020-12-05 NOTE — Assessment & Plan Note (Signed)
Known hemorrhoids with occasional bleeding.  Recommend she take daily Miralax to aid with bowel movement pattern.  Rectal exam deferred today per patient request, no recent bleeding.  Send home with stool kit and check CBC today.  Is on Eliquis, monitor bleeding episodes closely.

## 2020-12-05 NOTE — Assessment & Plan Note (Signed)
Chronic, stable with BP at goal for age today.  Followed by cardiology.  Will continue current medication regimen and adjust as needed.  Recommend she continue to monitor BP and HR at home daily + document and bring these to office visits.  Focus on DASH diet.  BMP today.  Return to office in March as scheduled.

## 2020-12-05 NOTE — Assessment & Plan Note (Signed)
Chronic, ongoing.  Started statin with cardiology in September.  Continue current medication regimen and adjust as needed.  Lipid panel today. 

## 2020-12-05 NOTE — Progress Notes (Signed)
BP 123/74   Pulse 61   Temp (!) 97.4 F (36.3 C) (Oral)   Ht 5' 6.69" (1.694 m)   Wt 163 lb 9.6 oz (74.2 kg)   LMP 01/03/1999 (Approximate)   SpO2 98%   BMI 25.86 kg/m    Subjective:    Patient ID: Melanie Wallace, female    DOB: 10-25-1946, 74 y.o.   MRN: [id number]  HPI: Melanie Wallace is a 74 y.o. female  Chief Complaint  Patient presents with  . Atrial Fibrillation  . Hyperlipidemia  . blood from rectum    Concerned that she has seen blood after bowel movement   HYPERTENSION Continues on Losartan-HCTZ 50-12.5 MG daily + Metoprolol 25 MG daily and Apixaban 5 MG BID -- for atrial fibrillation.  Atorvastatin for HLD, started 09/06/20 by cardiology -- she reports no ADR with this.  She last saw cardiology at [facility], Dr. Sabra Heck, on 09/06/20.  Recent echo on 03/17/20 with EF >55% and mild to moderate mitral valve regurgitation. Hypertension status: controlled  Satisfied with current treatment? yes Duration of hypertension: chronic BP monitoring frequency:  a few times a week BP range: 120-130/70 at home BP medication side effects:  no Medication compliance: good compliance Aspirin: no Recurrent headaches: no Visual changes: no Palpitations: no Dyspnea: no Chest pain: no Lower extremity edema: no Dizzy/lightheaded: no  The 10-year ASCVD risk score Mikey Bussing DC Jr., et al., 2013) is: 15.9%   Values used to calculate the score:     Age: 74 years     Sex: Female     Is Non-Hispanic African American: No     Diabetic: No     Tobacco smoker: No     Systolic Blood Pressure: 283 mmHg     Is BP treated: Yes     HDL Cholesterol: 51 mg/dL     Total Cholesterol: 187 mg/dL  RECTAL BLEEDING Noticed a little bleeding about one month ago.  Notices this when more stool coming out.  No bleeding recently.  Only a small "smear" on toilet paper -- pinkish in color, none in stool.  Has history of internal hemorrhoids, which she has had for years since her children.  These occasionally bleed  per her report, certain foods like beans make this worse.  On her colonoscopy in 2020 non-bleeding internal hemorrhoids were noted, medium sized.  Last colonoscopy was 07/06/2019, noted non cancerous polyps Duration: on and off for years Bright red rectal bleeding: no  Amount of blood: spotting  Frequency: on and off for years Melena: no  Spotting on toilet tissue: yes  Anal fullness: no  Perianal pain: no  Severity: mild Perianal irritation/itching: no  Constipation: occasional  Chronic straining/valsava:  no  Anal trauma/intercourse: no  Hemorrhoids: yes  Previous colonoscopy: yes    Relevant past medical, surgical, family and social history reviewed and updated as indicated. Interim medical history since our last visit reviewed. Allergies and medications reviewed and updated.  Review of Systems  Constitutional: Negative for activity change, appetite change, diaphoresis, fatigue and fever.  Respiratory: Negative for cough, chest tightness, shortness of breath and wheezing.   Cardiovascular: Negative for chest pain, palpitations and leg swelling.  Gastrointestinal: Negative.   Neurological: Negative.   Psychiatric/Behavioral: Negative.     Per HPI unless specifically indicated above     Objective:    BP 123/74   Pulse 61   Temp (!) 97.4 F (36.3 C) (Oral)   Ht 5' 6.69" (1.694 m)   Wt  163 lb 9.6 oz (74.2 kg)   LMP 01/03/1999 (Approximate)   SpO2 98%   BMI 25.86 kg/m   Wt Readings from Last 3 Encounters:  12/05/20 163 lb 9.6 oz (74.2 kg)  10/25/20 157 lb 3.2 oz (71.3 kg)  10/14/20 157 lb (71.2 kg)    Physical Exam Vitals and nursing note reviewed.  Constitutional:      General: She is awake. She is not in acute distress.    Appearance: She is well-developed and well-groomed. She is not ill-appearing.  HENT:     Head: Normocephalic.     Right Ear: Hearing normal.     Left Ear: Hearing normal.  Eyes:     General: Lids are normal.        Right eye: No  discharge.        Left eye: No discharge.     Conjunctiva/sclera: Conjunctivae normal.     Pupils: Pupils are equal, round, and reactive to light.  Neck:     Thyroid: No thyromegaly.     Vascular: No carotid bruit.  Cardiovascular:     Rate and Rhythm: Normal rate and regular rhythm.     Heart sounds: Normal heart sounds. No murmur heard. No gallop.   Pulmonary:     Effort: Pulmonary effort is normal. No accessory muscle usage or respiratory distress.     Breath sounds: Normal breath sounds.  Abdominal:     General: Bowel sounds are normal. There is no distension.     Palpations: Abdomen is soft. There is no hepatomegaly.     Tenderness: There is no abdominal tenderness.     Comments: Rectal exam deferred per patient request, no bleeding in one month.  Musculoskeletal:     Cervical back: Normal range of motion and neck supple.     Right lower leg: No edema.     Left lower leg: No edema.  Lymphadenopathy:     Cervical: No cervical adenopathy.  Skin:    General: Skin is warm and dry.  Neurological:     Mental Status: She is alert and oriented to person, place, and time.  Psychiatric:        Attention and Perception: Attention normal.        Mood and Affect: Mood normal.        Speech: Speech normal.        Behavior: Behavior normal. Behavior is cooperative.        Thought Content: Thought content normal.    Results for orders placed or performed in visit on 12/04/74  Basic metabolic panel  Result Value Ref Range   Glucose 56 (L) 65 - 99 mg/dL   BUN 15 8 - 27 mg/dL   Creatinine, Ser 0.55 (L) 0.57 - 1.00 mg/dL   GFR calc non Af Amer 94 >59 mL/min/1.73   GFR calc Af Amer 108 >59 mL/min/1.73   BUN/Creatinine Ratio 27 12 - 28   Sodium 140 134 - 144 mmol/L   Potassium 3.9 3.5 - 5.2 mmol/L   Chloride 100 96 - 106 mmol/L   CO2 20 20 - 29 mmol/L   Calcium 9.5 8.7 - 10.3 mg/dL  Lipid Panel w/o Chol/HDL Ratio  Result Value Ref Range   Cholesterol, Total 187 100 - 199 mg/dL    Triglycerides 104 0 - 149 mg/dL   HDL 51 >39 mg/dL   VLDL Cholesterol Cal 19 5 - 40 mg/dL   LDL Chol Calc (NIH) 117 (H) 0 - 99 mg/dL  Assessment & Plan:   Problem List Items Addressed This Visit      Cardiovascular and Mediastinum   Hypertension    Chronic, stable with BP at goal for age today.  Followed by cardiology.  Will continue current medication regimen and adjust as needed.  Recommend she continue to monitor BP and HR at home daily + document and bring these to office visits.  Focus on DASH diet.  BMP today.  Return to office in March as scheduled.      Relevant Medications   losartan-hydrochlorothiazide (HYZAAR) 50-12.5 MG tablet   Other Relevant Orders   Basic metabolic panel   Atrial fibrillation ([facility]) - Primary    Chronic, rate controlled.  Continue collaboration with cardiology and current medication regimen as prescribed by them.  Recent note and echo report reviewed.  BMP today.      Relevant Medications   losartan-hydrochlorothiazide (HYZAAR) 50-12.5 MG tablet   Internal hemorrhoids    Known hemorrhoids with occasional bleeding.  Recommend she take daily Miralax to aid with bowel movement pattern.  Rectal exam deferred today per patient request, no recent bleeding.  Send home with stool kit and check CBC today.  Is on Eliquis, monitor bleeding episodes closely.      Relevant Medications   losartan-hydrochlorothiazide (HYZAAR) 50-12.5 MG tablet   Other Relevant Orders   CBC with Differential/Platelet   Fecal occult blood, imunochemical     Hematopoietic and Hemostatic   Other thrombophilia ([place])    With atrial fibrillation and Eliquis, check CBC today and monitor for bleeding.        Other   Hyperlipidemia, mixed    Chronic, ongoing.  Started statin with cardiology in September.  Continue current medication regimen and adjust as needed.  Lipid panel today.      Relevant Medications   losartan-hydrochlorothiazide (HYZAAR) 50-12.5 MG tablet   Other  Relevant Orders   Lipid Panel w/o Chol/HDL Ratio       Follow up plan: Return for as scheduled in March and meet new PCP.

## 2020-12-06 ENCOUNTER — Other Ambulatory Visit: Payer: Medicare Other

## 2020-12-06 DIAGNOSIS — K625 Hemorrhage of anus and rectum: Secondary | ICD-10-CM | POA: Diagnosis not present

## 2020-12-06 LAB — CBC WITH DIFFERENTIAL/PLATELET
Basophils Absolute: 0 10*3/uL (ref 0.0–0.2)
Basos: 0 %
EOS (ABSOLUTE): 0.2 10*3/uL (ref 0.0–0.4)
Eos: 3 %
Hematocrit: 41.8 % (ref 34.0–46.6)
Hemoglobin: 14 g/dL (ref 11.1–15.9)
Immature Grans (Abs): 0 10*3/uL (ref 0.0–0.1)
Immature Granulocytes: 0 %
Lymphocytes Absolute: 1.9 10*3/uL (ref 0.7–3.1)
Lymphs: 26 %
MCH: 30.4 pg (ref 26.6–33.0)
MCHC: 33.5 g/dL (ref 31.5–35.7)
MCV: 91 fL (ref 79–97)
Monocytes Absolute: 0.8 10*3/uL (ref 0.1–0.9)
Monocytes: 11 %
Neutrophils Absolute: 4.2 10*3/uL (ref 1.4–7.0)
Neutrophils: 60 %
Platelets: 277 10*3/uL (ref 150–450)
RBC: 4.61 x10E6/uL (ref 3.77–5.28)
RDW: 12.4 % (ref 11.7–15.4)
WBC: 7.1 10*3/uL (ref 3.4–10.8)

## 2020-12-06 LAB — LIPID PANEL W/O CHOL/HDL RATIO
Cholesterol, Total: 135 mg/dL (ref 100–199)
HDL: 50 mg/dL (ref >39)
LDL Chol Calc (NIH): 58 mg/dL (ref 0–99)
Triglycerides: 160 mg/dL — ABNORMAL HIGH (ref 0–149)
VLDL Cholesterol Cal: 27 mg/dL (ref 5–40)

## 2020-12-06 LAB — BASIC METABOLIC PANEL
BUN/Creatinine Ratio: 28 (ref 12–28)
BUN: 18 mg/dL (ref 8–27)
CO2: 24 mmol/L (ref 20–29)
Calcium: 9.2 mg/dL (ref 8.7–10.3)
Chloride: 102 mmol/L (ref 96–106)
Creatinine, Ser: 0.64 mg/dL (ref 0.57–1.00)
GFR calc Af Amer: 102 mL/min/{1.73_m2} (ref >59)
GFR calc non Af Amer: 89 mL/min/{1.73_m2} (ref >59)
Glucose: 89 mg/dL (ref 65–99)
Potassium: 4.1 mmol/L (ref 3.5–5.2)
Sodium: 141 mmol/L (ref 134–144)

## 2020-12-06 NOTE — Progress Notes (Signed)
Please let Melanie Wallace know her labs have returned and overall I am very pleased.  Kidney function is normal.  CBC shows no anemia.  Your LDL, bad cholesterol, went from 117 to now 58 with statin on board.  This is under stroke prevention goal, great news!!  Continue the statin daily.  If any questions let me know.  Have a Merry Christmas!! Keep being awesome!!  Thank you for allowing me to participate in your care. Kindest regards, Mallery Harshman

## 2020-12-08 ENCOUNTER — Telehealth: Payer: Self-pay | Admitting: *Deleted

## 2020-12-08 LAB — FECAL OCCULT BLOOD, IMMUNOCHEMICAL: Fecal Occult Bld: NEGATIVE

## 2020-12-08 NOTE — Telephone Encounter (Signed)
Reviewed negative stool specimen results with the patient.

## 2020-12-08 NOTE — Progress Notes (Signed)
Please let Ms. Stallworth know her stool returned negative for blood.  If any questions let me know.

## 2020-12-13 DIAGNOSIS — H43813 Vitreous degeneration, bilateral: Secondary | ICD-10-CM | POA: Diagnosis not present

## 2021-01-25 DIAGNOSIS — H3589 Other specified retinal disorders: Secondary | ICD-10-CM | POA: Diagnosis not present

## 2021-01-25 DIAGNOSIS — H353221 Exudative age-related macular degeneration, left eye, with active choroidal neovascularization: Secondary | ICD-10-CM | POA: Diagnosis not present

## 2021-01-25 DIAGNOSIS — H5319 Other subjective visual disturbances: Secondary | ICD-10-CM | POA: Diagnosis not present

## 2021-01-25 DIAGNOSIS — H2513 Age-related nuclear cataract, bilateral: Secondary | ICD-10-CM | POA: Diagnosis not present

## 2021-02-22 DIAGNOSIS — H353121 Nonexudative age-related macular degeneration, left eye, early dry stage: Secondary | ICD-10-CM | POA: Diagnosis not present

## 2021-02-25 ENCOUNTER — Encounter: Payer: Self-pay | Admitting: Nurse Practitioner

## 2021-03-01 ENCOUNTER — Other Ambulatory Visit: Payer: Self-pay

## 2021-03-01 ENCOUNTER — Encounter: Payer: Self-pay | Admitting: Nurse Practitioner

## 2021-03-01 ENCOUNTER — Ambulatory Visit (INDEPENDENT_AMBULATORY_CARE_PROVIDER_SITE_OTHER): Payer: Medicare Other | Admitting: Nurse Practitioner

## 2021-03-01 VITALS — BP 135/86 | HR 73 | Temp 98.2°F | Wt 163.0 lb

## 2021-03-01 DIAGNOSIS — I480 Paroxysmal atrial fibrillation: Secondary | ICD-10-CM | POA: Diagnosis not present

## 2021-03-01 DIAGNOSIS — H353230 Exudative age-related macular degeneration, bilateral, stage unspecified: Secondary | ICD-10-CM

## 2021-03-01 DIAGNOSIS — I10 Essential (primary) hypertension: Secondary | ICD-10-CM | POA: Diagnosis not present

## 2021-03-01 DIAGNOSIS — H353290 Exudative age-related macular degeneration, unspecified eye, stage unspecified: Secondary | ICD-10-CM | POA: Insufficient documentation

## 2021-03-01 DIAGNOSIS — E782 Mixed hyperlipidemia: Secondary | ICD-10-CM | POA: Diagnosis not present

## 2021-03-01 DIAGNOSIS — D6869 Other thrombophilia: Secondary | ICD-10-CM | POA: Diagnosis not present

## 2021-03-01 MED ORDER — CETIRIZINE HCL 5 MG PO TABS: 5.0000 mg | ORAL_TABLET | Freq: Every day | ORAL | 1 refills | Status: DC

## 2021-03-01 NOTE — Progress Notes (Signed)
BP 135/86   Pulse 73   Temp 98.2 F (36.8 C)   Wt 163 lb (73.9 kg)   LMP 01/03/1999 (Approximate)   SpO2 97%   BMI 25.76 kg/m    Subjective:    Patient ID: Melanie Wallace, female    DOB: Jan 04, 1946, 75 y.o.   MRN: [id number]  HPI: Melanie Wallace is a 75 y.o. female  Chief Complaint  Patient presents with  . Hypertension  . Paroxysmal atrial fibrillation   . Depression   HYPERTENSION Continues on Losartan-HCTZ 50-12.5 MG daily + Metoprolol 25 MG daily and Apixaban 5 MG BID -- for atrial fibrillation.  Atorvastatin for HLD, started 09/06/20 by cardiology -- she reports no ADR with this.  She last saw cardiology at [facility], Dr. Sabra Heck, on 09/06/20.  Recent echo on 03/17/20 with EF >55% and mild to moderate mitral valve regurgitation. Hypertension status: controlled  Satisfied with current treatment? yes Duration of hypertension: chronic BP monitoring frequency:  a few times a week BP range: 120-130/70 at home BP medication side effects:  no Medication compliance: good compliance Aspirin: no Recurrent headaches: no Visual changes: no Palpitations: no Dyspnea: no Chest pain: no Lower extremity edema: no Dizzy/lightheaded: no  The 10-year ASCVD risk score Mikey Bussing DC Jr., et al., 2013) is: 17.9%   Values used to calculate the score:     Age: 78 years     Sex: Female     Is Non-Hispanic African American: No     Diabetic: No     Tobacco smoker: No     Systolic Blood Pressure: 643 mmHg     Is BP treated: Yes     HDL Cholesterol: 50 mg/dL     Total Cholesterol: 135 mg/dL  Patient has been seeing a specialist for Macular Degeneration.  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  Right eye is not as effected and does not receive injection in that eye yet.  Relevant past medical, surgical, family and social history reviewed and updated as indicated. Interim medical history since our last visit reviewed. Allergies and medications  reviewed and updated.  Review of Systems  Constitutional: Negative for activity change, appetite change, diaphoresis, fatigue and fever.  Respiratory: Negative for cough, chest tightness, shortness of breath and wheezing.   Cardiovascular: Negative for chest pain, palpitations and leg swelling.  Gastrointestinal: Negative.   Neurological: Negative.   Psychiatric/Behavioral: Negative.     Per HPI unless specifically indicated above     Objective:    BP 135/86   Pulse 73   Temp 98.2 F (36.8 C)   Wt 163 lb (73.9 kg)   LMP 01/03/1999 (Approximate)   SpO2 97%   BMI 25.76 kg/m   Wt Readings from Last 3 Encounters:  03/01/21 163 lb (73.9 kg)  12/05/20 163 lb 9.6 oz (74.2 kg)  10/25/20 157 lb 3.2 oz (71.3 kg)    Physical Exam Vitals and nursing note reviewed.  Constitutional:      General: She is awake. She is not in acute distress.    Appearance: She is well-developed and well-groomed. She is not ill-appearing.  HENT:     Head: Normocephalic.     Right Ear: Hearing normal.     Left Ear: Hearing normal.  Eyes:     General: Lids are normal.        Right eye: No discharge.        Left eye: No discharge.  Conjunctiva/sclera: Conjunctivae normal.     Pupils: Pupils are equal, round, and reactive to light.  Neck:     Thyroid: No thyromegaly.     Vascular: No carotid bruit.  Cardiovascular:     Rate and Rhythm: Normal rate and regular rhythm.     Heart sounds: Normal heart sounds. No murmur heard. No gallop.   Pulmonary:     Effort: Pulmonary effort is normal. No accessory muscle usage or respiratory distress.     Breath sounds: Normal breath sounds.  Abdominal:     General: Bowel sounds are normal. There is no distension.     Palpations: Abdomen is soft. There is no hepatomegaly.     Tenderness: There is no abdominal tenderness.  Musculoskeletal:     Cervical back: Normal range of motion and neck supple.     Right lower leg: No edema.     Left lower leg: No edema.   Lymphadenopathy:     Cervical: No cervical adenopathy.  Skin:    General: Skin is warm and dry.  Neurological:     Mental Status: She is alert and oriented to person, place, and time.  Psychiatric:        Attention and Perception: Attention normal.        Mood and Affect: Mood normal.        Speech: Speech normal.        Behavior: Behavior normal. Behavior is cooperative.        Thought Content: Thought content normal.    Results for orders placed or performed in visit on 12/05/20  Fecal occult blood, imunochemical   Specimen: Stool   ST  Result Value Ref Range   Fecal Occult Bld Negative Negative  Basic metabolic panel  Result Value Ref Range   Glucose 89 65 - 99 mg/dL   BUN 18 8 - 27 mg/dL   Creatinine, Ser 0.64 0.57 - 1.00 mg/dL   GFR calc non Af Amer 89 >59 mL/min/1.73   GFR calc Af Amer 102 >59 mL/min/1.73   BUN/Creatinine Ratio 28 12 - 28   Sodium 141 134 - 144 mmol/L   Potassium 4.1 3.5 - 5.2 mmol/L   Chloride 102 96 - 106 mmol/L   CO2 24 20 - 29 mmol/L   Calcium 9.2 8.7 - 10.3 mg/dL  Lipid Panel w/o Chol/HDL Ratio  Result Value Ref Range   Cholesterol, Total 135 100 - 199 mg/dL   Triglycerides 160 (H) 0 - 149 mg/dL   HDL 50 >39 mg/dL   VLDL Cholesterol Cal 27 5 - 40 mg/dL   LDL Chol Calc (NIH) 58 0 - 99 mg/dL  CBC with Differential/Platelet  Result Value Ref Range   WBC 7.1 3.4 - 10.8 x10E3/uL   RBC 4.61 3.77 - 5.28 x10E6/uL   Hemoglobin 14.0 11.1 - 15.9 g/dL   Hematocrit 41.8 34.0 - 46.6 %   MCV 91 79 - 97 fL   MCH 30.4 26.6 - 33.0 pg   MCHC 33.5 31.5 - 35.7 g/dL   RDW 12.4 11.7 - 15.4 %   Platelets 277 150 - 450 x10E3/uL   Neutrophils 60 Not Estab. %   Lymphs 26 Not Estab. %   Monocytes 11 Not Estab. %   Eos 3 Not Estab. %   Basos 0 Not Estab. %   Neutrophils Absolute 4.2 1.4 - 7.0 x10E3/uL   Lymphocytes Absolute 1.9 0.7 - 3.1 x10E3/uL   Monocytes Absolute 0.8 0.1 - 0.9 x10E3/uL   EOS (  ABSOLUTE) 0.2 0.0 - 0.4 x10E3/uL   Basophils Absolute 0.0  0.0 - 0.2 x10E3/uL   Immature Granulocytes 0 Not Estab. %   Immature Grans (Abs) 0.0 0.0 - 0.1 x10E3/uL      Assessment & Plan:   Problem List Items Addressed This Visit      Cardiovascular and Mediastinum   Hypertension    Chronic, stable with BP at goal for age today.  Followed by cardiology.  Will continue current medication regimen and adjust as needed.  Recommend she continue to monitor BP and HR at home daily + document and bring these to office visits.  Focus on DASH diet.  Labs ordered today.       Atrial fibrillation (HCC) - Primary    Chronic, rate controlled.  Continue collaboration with cardiology and current medication regimen as prescribed by them.  Recent note and echo report reviewed. Labs ordered today.        Hematopoietic and Hemostatic   Other thrombophilia ([place])    With atrial fibrillation and Eliquis, check CBC today and monitor for bleeding.        Other   Hyperlipidemia, mixed    Chronic, ongoing.  Started statin with cardiology in September.  Continue current medication regimen and adjust as needed.  Lipid panel today.      Macular degeneration, wet (HCC)    Chronic.  Being treated by specialist.  Receiving injections in left eye.          Follow up plan: Return in about 6 months (around 09/01/2021) for Physical and Fasting labs.

## 2021-03-01 NOTE — Assessment & Plan Note (Signed)
Chronic, ongoing.  Started statin with cardiology in September.  Continue current medication regimen and adjust as needed.  Lipid panel today.

## 2021-03-01 NOTE — Assessment & Plan Note (Signed)
With atrial fibrillation and Eliquis, check CBC today and monitor for bleeding.

## 2021-03-01 NOTE — Assessment & Plan Note (Signed)
Chronic, rate controlled.  Continue collaboration with cardiology and current medication regimen as prescribed by them.  Recent note and echo report reviewed. Labs ordered today.

## 2021-03-01 NOTE — Assessment & Plan Note (Signed)
Chronic, stable with BP at goal for age today.  Followed by cardiology.  Will continue current medication regimen and adjust as needed.  Recommend she continue to monitor BP and HR at home daily + document and bring these to office visits.  Focus on DASH diet.  Labs ordered today.

## 2021-03-01 NOTE — Assessment & Plan Note (Signed)
Chronic.  Being treated by specialist.  Receiving injections in left eye.

## 2021-03-02 LAB — BASIC METABOLIC PANEL
BUN/Creatinine Ratio: 36 — ABNORMAL HIGH (ref 12–28)
BUN: 21 mg/dL (ref 8–27)
CO2: 20 mmol/L (ref 20–29)
Calcium: 9.2 mg/dL (ref 8.7–10.3)
Chloride: 105 mmol/L (ref 96–106)
Creatinine, Ser: 0.59 mg/dL (ref 0.57–1.00)
Glucose: 90 mg/dL (ref 65–99)
Potassium: 4.1 mmol/L (ref 3.5–5.2)
Sodium: 142 mmol/L (ref 134–144)
eGFR: 95 mL/min/{1.73_m2} (ref >59)

## 2021-03-02 LAB — CBC WITH DIFFERENTIAL/PLATELET
Basophils Absolute: 0 10*3/uL (ref 0.0–0.2)
Basos: 0 %
EOS (ABSOLUTE): 0.1 10*3/uL (ref 0.0–0.4)
Eos: 3 %
Hematocrit: 42.1 % (ref 34.0–46.6)
Hemoglobin: 14.1 g/dL (ref 11.1–15.9)
Immature Grans (Abs): 0 10*3/uL (ref 0.0–0.1)
Immature Granulocytes: 0 %
Lymphocytes Absolute: 1.7 10*3/uL (ref 0.7–3.1)
Lymphs: 30 %
MCH: 29.6 pg (ref 26.6–33.0)
MCHC: 33.5 g/dL (ref 31.5–35.7)
MCV: 88 fL (ref 79–97)
Monocytes Absolute: 0.6 10*3/uL (ref 0.1–0.9)
Monocytes: 11 %
Neutrophils Absolute: 3.2 10*3/uL (ref 1.4–7.0)
Neutrophils: 56 %
Platelets: 257 10*3/uL (ref 150–450)
RBC: 4.76 x10E6/uL (ref 3.77–5.28)
RDW: 12.4 % (ref 11.7–15.4)
WBC: 5.6 10*3/uL (ref 3.4–10.8)

## 2021-03-02 LAB — LIPID PANEL
Chol/HDL Ratio: 2.4 ratio (ref 0.0–4.4)
Cholesterol, Total: 122 mg/dL (ref 100–199)
HDL: 51 mg/dL (ref >39)
LDL Chol Calc (NIH): 57 mg/dL (ref 0–99)
Triglycerides: 69 mg/dL (ref 0–149)
VLDL Cholesterol Cal: 14 mg/dL (ref 5–40)

## 2021-03-02 NOTE — Progress Notes (Signed)
Please let Ms. Melanie Wallace know that her lab work looks good.  Her cholesterol has improved since starting on the cholesterol medication.  Other lab work looks good.  We will check again at her 6 month follow up.

## 2021-03-07 DIAGNOSIS — I10 Essential (primary) hypertension: Secondary | ICD-10-CM | POA: Diagnosis not present

## 2021-03-07 DIAGNOSIS — I480 Paroxysmal atrial fibrillation: Secondary | ICD-10-CM | POA: Diagnosis not present

## 2021-03-27 DIAGNOSIS — H353221 Exudative age-related macular degeneration, left eye, with active choroidal neovascularization: Secondary | ICD-10-CM | POA: Diagnosis not present

## 2021-05-08 DIAGNOSIS — H353221 Exudative age-related macular degeneration, left eye, with active choroidal neovascularization: Secondary | ICD-10-CM | POA: Diagnosis not present

## 2021-05-18 ENCOUNTER — Emergency Department
Admission: EM | Admit: 2021-05-18 | Discharge: 2021-05-18 | Disposition: A | Discharge status: Home / Self Care | Payer: Medicare Other | Attending: Emergency Medicine | Admitting: Emergency Medicine

## 2021-05-18 ENCOUNTER — Other Ambulatory Visit: Payer: Self-pay

## 2021-05-18 DIAGNOSIS — Z79899 Other long term (current) drug therapy: Secondary | ICD-10-CM | POA: Insufficient documentation

## 2021-05-18 DIAGNOSIS — Z853 Personal history of malignant neoplasm of breast: Secondary | ICD-10-CM | POA: Diagnosis not present

## 2021-05-18 DIAGNOSIS — I480 Paroxysmal atrial fibrillation: Secondary | ICD-10-CM | POA: Diagnosis not present

## 2021-05-18 DIAGNOSIS — R9431 Abnormal electrocardiogram [ECG] [EKG]: Secondary | ICD-10-CM | POA: Diagnosis not present

## 2021-05-18 DIAGNOSIS — I10 Essential (primary) hypertension: Secondary | ICD-10-CM | POA: Insufficient documentation

## 2021-05-18 DIAGNOSIS — Z8544 Personal history of malignant neoplasm of other female genital organs: Secondary | ICD-10-CM | POA: Diagnosis not present

## 2021-05-18 DIAGNOSIS — Z7901 Long term (current) use of anticoagulants: Secondary | ICD-10-CM | POA: Diagnosis not present

## 2021-05-18 DIAGNOSIS — R202 Paresthesia of skin: Secondary | ICD-10-CM | POA: Diagnosis present

## 2021-05-18 DIAGNOSIS — Z87891 Personal history of nicotine dependence: Secondary | ICD-10-CM | POA: Diagnosis not present

## 2021-05-18 DIAGNOSIS — I4891 Unspecified atrial fibrillation: Secondary | ICD-10-CM | POA: Diagnosis not present

## 2021-05-18 DIAGNOSIS — M25512 Pain in left shoulder: Secondary | ICD-10-CM | POA: Diagnosis not present

## 2021-05-18 LAB — CBC
HCT: 43 % (ref 36.0–46.0)
Hemoglobin: 15 g/dL (ref 12.0–15.0)
MCH: 29.8 pg (ref 26.0–34.0)
MCHC: 34.9 g/dL (ref 30.0–36.0)
MCV: 85.5 fL (ref 80.0–100.0)
Platelets: 266 10*3/uL (ref 150–400)
RBC: 5.03 MIL/uL (ref 3.87–5.11)
RDW: 12.6 % (ref 11.5–15.5)
WBC: 6.6 10*3/uL (ref 4.0–10.5)
nRBC: 0 % (ref 0.0–0.2)

## 2021-05-18 LAB — BASIC METABOLIC PANEL
Anion gap: 10 (ref 5–15)
BUN: 16 mg/dL (ref 8–23)
CO2: 24 mmol/L (ref 22–32)
Calcium: 9.4 mg/dL (ref 8.9–10.3)
Chloride: 103 mmol/L (ref 98–111)
Creatinine, Ser: 0.52 mg/dL (ref 0.44–1.00)
GFR, Estimated: >60 mL/min (ref >60)
Glucose, Bld: 118 mg/dL — ABNORMAL HIGH (ref 70–99)
Potassium: 3.8 mmol/L (ref 3.5–5.1)
Sodium: 137 mmol/L (ref 135–145)

## 2021-05-18 NOTE — ED Provider Notes (Signed)
[facility] Emergency Department Provider Note  ____________________________________________   Event Date/Time   First MD Initiated Contact with Patient 05/18/21 1137     (approximate)  I have reviewed the triage vital signs and the nursing notes.   HISTORY  Chief Complaint Atrial Fibrillation    HPI Melanie Wallace is a 75 y.o. female with history of A. fib, hypertension, here with atrial fibrillation.  The patient states that she was seen at urgent care today for a transient tapping sensation on her left shoulder.  She thought it was related to a possible muscular injury when she was walking her large dog.  However, she went to urgent care and had an EKG done which showed her to be in atrial fibrillation.  She has a history of paroxysmal A. fib, but is not sure if she is always in this.  She subsequently presents for evaluation and to be checked out.  She denies any associated shortness of breath, lightheadedness, dizziness.  Denies any fevers or chills.  No recent medication changes.  She has been taking her blood thinner and blood pressure medications.  No current chest pain.  No ongoing left shoulder symptoms.  No specific alleviating or aggravating factors.       Past Medical History:  Diagnosis Date  . Allergic rhinitis   . Atrial fibrillation ([place])   . Breast CA ([place])   . Depression   . Hypertension   . Osteoporosis   . Vaginal cancer [facility])     Patient Active Problem List   Diagnosis Date Noted  . 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 Internal hemorrhoids 12/05/2020  . Other thrombophilia ([place]) 12/02/2020  . Hyperlipidemia, mixed 08/29/2020  . Gastroesophageal reflux disease 05/04/2020  . Atrial fibrillation ([place]) 02/04/2017  . Osteopenia of lumbar spine 07/14/2015  . History of cancer of vagina 07/14/2015  . Allergic rhinitis 07/14/2015  . Hypertension 07/14/2015  . Depression 07/14/2015  . History of breast cancer 04/13/2013     Past Surgical History:  Procedure Laterality Date  . BREAST SURGERY  03/2015  . NASAL SEPTUM SURGERY    . TUBAL LIGATION      Prior to Admission medications   Medication Sig Start Date End Date Taking? Authorizing Provider  apixaban (ELIQUIS) 5 MG TABS tablet Take 5 mg by mouth 2 (two) times daily.    [provider]  atorvastatin (LIPITOR) 10 MG tablet Take 10 mg by mouth daily. 09/06/20   [provider]  calcium-vitamin D (OSCAL WITH D) 500-200 MG-UNIT per tablet Take 1 tablet by mouth.    [provider]  cetirizine (ZYRTEC) 5 MG tablet Take 1 tablet (5 mg total) by mouth daily. 03/01/21   Jon Billings, NP  citalopram (CELEXA) 20 MG tablet Take 1 tablet (20 mg total) by mouth daily. 08/29/20   Cannady, Henrine Screws T, NP  losartan-hydrochlorothiazide (HYZAAR) 50-12.5 MG tablet Take 1 tablet by mouth daily. 12/05/20   Cannady, Henrine Screws T, NP  metoprolol succinate (TOPROL-XL) 25 MG 24 hr tablet Take 25 mg by mouth daily.    [provider]  Multiple Vitamins-Minerals (OCUVITE PO) Take by mouth daily.    [provider]    Allergies Sulfa antibiotics, Sulfasalazine, Penicillin g benzathine, Green tea leaf ext, and Pen-vee k [penicillin v]  Family History  Problem Relation Age of Onset  . Cancer Mother        breast and bone  . Hypertension Mother   . Cancer Father  prostate  . Hypertension Sister   . Hypertension Daughter   . Hypothyroidism Daughter   . Allergies Daughter   . Hyperlipidemia Maternal Grandmother   . Heart attack Maternal Grandmother   . Stroke Paternal Grandfather     Social History Social History   Tobacco Use  . Smoking status: Former Research scientist (life sciences)  . Smokeless tobacco: Never Used  . Tobacco comment: quit in 95- smoked for 3 years total   Vaping Use  . Vaping Use: Never used  Substance Use Topics  . Alcohol use: No    Alcohol/week: 0.0 standard drinks  . Drug use: No    Review of Systems  Review of  Systems  Constitutional: Positive for fatigue. Negative for fever.  HENT: Negative for congestion and sore throat.   Eyes: Negative for visual disturbance.  Respiratory: Negative for cough and shortness of breath.   Cardiovascular: Negative for chest pain.  Gastrointestinal: Negative for abdominal pain, diarrhea, nausea and vomiting.  Genitourinary: Negative for flank pain.  Musculoskeletal: Negative for back pain and neck pain.  Skin: Negative for rash and wound.  All other systems reviewed and are negative.    ____________________________________________  PHYSICAL EXAM:      VITAL SIGNS: ED Triage Vitals  Enc Vitals Group     BP 05/18/21 1001 (!) 145/96     Pulse Rate 05/18/21 1001 77     Resp 05/18/21 1001 18     Temp 05/18/21 1001 98.4 F (36.9 C)     Temp Source 05/18/21 1001 Oral     SpO2 05/18/21 1001 97 %     Weight 05/18/21 0959 162 lb (73.5 kg)     Height 05/18/21 0959 5\' 7"  (1.702 m)     Head Circumference --      Peak Flow --      Pain Score 05/18/21 0957 0     Pain Loc --      Pain Edu? --      Excl. in [place]? --      Physical Exam Vitals and nursing note reviewed.  Constitutional:      General: She is not in acute distress.    Appearance: She is well-developed.  HENT:     Head: Normocephalic and atraumatic.  Eyes:     Conjunctiva/sclera: Conjunctivae normal.  Cardiovascular:     Rate and Rhythm: Normal rate. Rhythm irregular.     Heart sounds: Normal heart sounds. No murmur heard. No friction rub.  Pulmonary:     Effort: Pulmonary effort is normal. No respiratory distress.     Breath sounds: Normal breath sounds. No wheezing or rales.  Abdominal:     General: There is no distension.     Palpations: Abdomen is soft.     Tenderness: There is no abdominal tenderness.  Musculoskeletal:     Cervical back: Neck supple.  Skin:    General: Skin is warm.     Capillary Refill: Capillary refill takes less than 2 seconds.  Neurological:     Mental  Status: She is alert and oriented to person, place, and time.     Motor: No abnormal muscle tone.       ____________________________________________   LABS (all labs ordered are listed, but only abnormal results are displayed)  Labs Reviewed  BASIC METABOLIC PANEL - Abnormal; Notable for the following components:      Result Value   Glucose, Bld 118 (*)    All other components within normal limits  CBC  ____________________________________________  EKG: Atrial fibrillation, ventricular rate 75.  QRS 74, QTc 408.  No acute ST elevations or Vesprin acute evidence of acute ischemia or infarct. ________________________________________  RADIOLOGY All imaging, including plain films, CT scans, and ultrasounds, independently reviewed by me, and interpretations confirmed via formal radiology reads.  ED MD interpretation:     Official radiology report(s): No results found.  ____________________________________________  PROCEDURES   Procedure(s) performed (including Critical Care):  Procedures  ____________________________________________  INITIAL IMPRESSION / MDM / Imogene / ED COURSE  As part of my medical decision making, I reviewed the following data within the [place] notes reviewed and incorporated, Old chart reviewed, Notes from prior ED visits, and [place] Controlled Substance Database       *SHARLINE LEHANE was evaluated in Emergency Department on 05/18/2021 for the symptoms described in the history of present illness. She was evaluated in the context of the global COVID-19 pandemic, which necessitated consideration that the patient might be at risk for infection with the SARS-CoV-2 virus that causes COVID-19. Institutional protocols and algorithms that pertain to the evaluation of patients at risk for COVID-19 are in a state of rapid change based on information released by regulatory bodies including the CDC and federal and state  organizations. These policies and algorithms were followed during the patient's care in the ED.  Some ED evaluations and interventions may be delayed as a result of limited staffing during the pandemic.*     Medical Decision Making: 75 year old well-appearing female here with possible symptomatic A. fib.  Patient noted to be in A. fib here but is rate controlled.  She is otherwise asymptomatic.  She was maintained on cardiac monitor and maintained rate in the 70s to 80s.  She is able to ambulate without tachycardia.  Screening lab work is unremarkable.  No significant anemia or electrolyte issue.  EKG nonischemic with no chest pains or signs to suggest ischemia.  She had a normal nuclear stress test fairly recently.  Patient is otherwise well-appearing.  She states that her symptoms began after walking her dog, but also after she had had some caffeine and a chocolate cake.  Unclear whether she has transient symptomatic palpitations versus more musculoskeletal pain related to walking her dog.  No apparent bony injuries.  Given that her rate is well controlled, she is on appropriate medications, no apparent emergent indication for further evaluation or cardioversion.  Will refer her to her cardiologist to discuss further treatment and monitoring options.  Advised her to avoid caffeine, hydrate, and monitor her heart rate and blood pressure at home.  ____________________________________________  FINAL CLINICAL IMPRESSION(S) / ED DIAGNOSES  Final diagnoses:  Paroxysmal atrial fibrillation ([place])     MEDICATIONS GIVEN DURING THIS VISIT:  Medications - No data to display   ED Discharge Orders    None       Note:  This document was prepared using Dragon voice recognition software and may include unintentional dictation errors.   Duffy Bruce, MD 05/18/21 [id number]

## 2021-05-18 NOTE — Discharge Instructions (Addendum)
As we discussed, I suspect your AFib may be triggered by caffeine. Try to avoid caffeine, drink plenty of fluid, and try to rest.  Check your HR and blood pressure twice a day and keep a log of this for the next week or two  Call your Cardiologist - it may be that she wants you to come in for another EKG and possible longer-term monitoring. If you are always in AFib, this is common and as long as your rate is under control, is okay. If not, you may ultimately benefit from a cardioversion or conversion to normal rhythm.

## 2021-05-18 NOTE — ED Triage Notes (Signed)
Pt arrives via POV from UC after she was seen for L shoulder pain- pt has a hx of afib but it has been under control until UC noticed she was in afib today and was sent here for further eval

## 2021-06-21 DIAGNOSIS — H353111 Nonexudative age-related macular degeneration, right eye, early dry stage: Secondary | ICD-10-CM | POA: Diagnosis not present

## 2021-06-21 DIAGNOSIS — H353221 Exudative age-related macular degeneration, left eye, with active choroidal neovascularization: Secondary | ICD-10-CM | POA: Diagnosis not present

## 2021-06-27 ENCOUNTER — Encounter: Payer: Self-pay | Admitting: Nurse Practitioner

## 2021-08-07 DIAGNOSIS — H353221 Exudative age-related macular degeneration, left eye, with active choroidal neovascularization: Secondary | ICD-10-CM | POA: Diagnosis not present

## 2021-08-17 ENCOUNTER — Encounter: Payer: Medicare Other | Admitting: Nurse Practitioner

## 2021-08-22 ENCOUNTER — Other Ambulatory Visit: Payer: Self-pay

## 2021-08-22 ENCOUNTER — Encounter: Payer: Self-pay | Admitting: Nurse Practitioner

## 2021-08-22 ENCOUNTER — Ambulatory Visit (INDEPENDENT_AMBULATORY_CARE_PROVIDER_SITE_OTHER): Payer: Medicare Other | Admitting: Nurse Practitioner

## 2021-08-22 VITALS — BP 123/72 | HR 78 | Temp 97.9°F | Ht 66.6 in | Wt 160.8 lb

## 2021-08-22 DIAGNOSIS — I480 Paroxysmal atrial fibrillation: Secondary | ICD-10-CM | POA: Diagnosis not present

## 2021-08-22 DIAGNOSIS — Z0000 Encounter for general adult medical examination without abnormal findings: Secondary | ICD-10-CM

## 2021-08-22 DIAGNOSIS — I10 Essential (primary) hypertension: Secondary | ICD-10-CM

## 2021-08-22 DIAGNOSIS — F325 Major depressive disorder, single episode, in full remission: Secondary | ICD-10-CM | POA: Diagnosis not present

## 2021-08-22 DIAGNOSIS — H353230 Exudative age-related macular degeneration, bilateral, stage unspecified: Secondary | ICD-10-CM | POA: Diagnosis not present

## 2021-08-22 DIAGNOSIS — E782 Mixed hyperlipidemia: Secondary | ICD-10-CM | POA: Diagnosis not present

## 2021-08-22 DIAGNOSIS — R5383 Other fatigue: Secondary | ICD-10-CM | POA: Diagnosis not present

## 2021-08-22 LAB — URINALYSIS, ROUTINE W REFLEX MICROSCOPIC
Bilirubin, UA: NEGATIVE
Glucose, UA: NEGATIVE
Ketones, UA: NEGATIVE
Leukocytes,UA: NEGATIVE
Nitrite, UA: NEGATIVE
Protein,UA: NEGATIVE
Specific Gravity, UA: 1.02 (ref 1.005–1.030)
Urobilinogen, Ur: 0.2 mg/dL (ref 0.2–1.0)
pH, UA: 6.5 (ref 5.0–7.5)

## 2021-08-22 LAB — MICROSCOPIC EXAMINATION
Bacteria, UA: NONE SEEN
WBC, UA: NONE SEEN /hpf (ref 0–5)

## 2021-08-22 MED ORDER — CITALOPRAM HYDROBROMIDE 20 MG PO TABS: 20.0000 mg | ORAL_TABLET | Freq: Every day | ORAL | 4 refills | Status: DC

## 2021-08-22 MED ORDER — LOSARTAN POTASSIUM-HCTZ 50-12.5 MG PO TABS: 1.0000 | ORAL_TABLET | Freq: Every day | ORAL | 4 refills | Status: DC

## 2021-08-22 NOTE — Progress Notes (Signed)
BP 123/72   Pulse 78   Temp 97.9 F (36.6 C) (Oral)   Ht 5' 6.6" (1.692 m)   Wt 160 lb 12.8 oz (72.9 kg)   LMP 01/03/1999 (Approximate)   SpO2 97%   BMI 25.49 kg/m    Subjective:    Patient ID: Melanie Wallace, female    DOB: 19-Jan-1946, 75 y.o.   MRN: [id number]  HPI: Melanie Wallace is a 75 y.o. female presenting on 08/22/2021 for comprehensive medical examination. Current medical complaints include:none  She currently lives with: Menopausal Symptoms: no  HYPERTENSION Hypertension status: controlled  Satisfied with current treatment? no Duration of hypertension: years BP monitoring frequency:  daily BP range: 130/80 BP medication side effects:  no Medication compliance: excellent compliance Previous BP meds:HCTZ and losartan (cozaar) Aspirin:  Eliquis Recurrent headaches: no Visual changes: no Palpitations: no Dyspnea: no Chest pain: no Lower extremity edema: no Dizzy/lightheaded: no  DEPRESSION Depression Screen done today and results listed below:  Depression screen [facility] 2/9 08/22/2021 03/01/2021 12/05/2020 08/29/2020 02/26/2020  Decreased Interest 0 0 0 0 0  Down, Depressed, Hopeless 0 0 0 0 0  PHQ - 2 Score 0 0 0 0 0  Altered sleeping 0 0 0 0 0  Tired, decreased energy 0 0 0 0 0  Change in appetite 0 0 0 0 0  Feeling bad or failure about yourself  0 0 0 0 0  Trouble concentrating 0 0 0 0 0  Moving slowly or fidgety/restless 0 0 0 0 0  Suicidal thoughts 0 0 0 0 0  PHQ-9 Score 0 0 0 0 0  Difficult doing work/chores Not difficult at all - - Not difficult at all -  Some recent data might be hidden    The patient does not have a history of falls. I did complete a risk assessment for falls. A plan of care for falls was documented.   Past Medical History:  Past Medical History:  Diagnosis Date   Allergic rhinitis    Atrial fibrillation ([place])    Breast CA ([place])    Depression    Hypertension    Osteoporosis    Vaginal cancer [facility])     Surgical History:   Past Surgical History:  Procedure Laterality Date   BREAST SURGERY  03/2015   NASAL SEPTUM SURGERY     TUBAL LIGATION      Medications:  Current Outpatient Medications on File Prior to Visit  Medication Sig   apixaban (ELIQUIS) 5 MG TABS tablet Take 5 mg by mouth 2 (two) times daily.   atorvastatin (LIPITOR) 10 MG tablet Take 10 mg by mouth daily.   calcium-vitamin D (OSCAL WITH D) 500-200 MG-UNIT per tablet Take 1 tablet by mouth.   cetirizine (ZYRTEC) 5 MG tablet Take 1 tablet (5 mg total) by mouth daily.   metoprolol succinate (TOPROL-XL) 25 MG 24 hr tablet Take 25 mg by mouth daily.   Multiple Vitamins-Minerals (PRESERVISION AREDS 2 PO) Take 1 tablet by mouth daily.   No current facility-administered medications on file prior to visit.    Allergies:  Allergies  Allergen Reactions   Sulfa Antibiotics Hives   Sulfasalazine Hives   Green Tea Leaf Ext Palpitations   Pen-Vee K [Penicillin V] Rash   Penicillin G Benzathine Rash    Social History:  Social History   Socioeconomic History   Marital status: Married    Spouse name: Not on file   Number of children: Not on file  Years of education: 72   Highest education level: High school graduate  Occupational History   Not on file  Tobacco Use   Smoking status: Former   Smokeless tobacco: Never   Tobacco comments:    quit in 95- smoked for 3 years total   Vaping Use   Vaping Use: Never used  Substance and Sexual Activity   Alcohol use: No    Alcohol/week: 0.0 standard drinks   Drug use: No   Sexual activity: Yes  Other Topics Concern   Not on file  Social History Narrative   Not on file   Social Determinants of Health   Financial Resource Strain: Not on file  Food Insecurity: Not on file  Transportation Needs: Not on file  Physical Activity: Not on file  Stress: Not on file  Social Connections: Not on file  Intimate Partner Violence: Not on file   Social History   Tobacco Use  Smoking Status Former   Smokeless Tobacco Never  Tobacco Comments   quit in 95- smoked for 3 years total    Social History   Substance and Sexual Activity  Alcohol Use No   Alcohol/week: 0.0 standard drinks    Family History:  Family History  Problem Relation Age of Onset   Cancer Mother        breast and bone   Hypertension Mother    Cancer Father        prostate   Hypertension Sister    Hypertension Daughter    Hypothyroidism Daughter    Allergies Daughter    Hyperlipidemia Maternal Grandmother    Heart attack Maternal Grandmother    Stroke Paternal Grandfather     Past medical history, surgical history, medications, allergies, family history and social history reviewed with patient today and changes made to appropriate areas of the chart.   Review of Systems  Eyes:  Negative for blurred vision and double vision.  Respiratory:  Negative for shortness of breath.   Cardiovascular:  Negative for chest pain, palpitations and leg swelling.  Neurological:  Negative for dizziness and headaches.  All other ROS negative except what is listed above and in the HPI.      Objective:    BP 123/72   Pulse 78   Temp 97.9 F (36.6 C) (Oral)   Ht 5' 6.6" (1.692 m)   Wt 160 lb 12.8 oz (72.9 kg)   LMP 01/03/1999 (Approximate)   SpO2 97%   BMI 25.49 kg/m   Wt Readings from Last 3 Encounters:  08/22/21 160 lb 12.8 oz (72.9 kg)  05/18/21 162 lb (73.5 kg)  03/01/21 163 lb (73.9 kg)    Physical Exam Vitals and nursing note reviewed.  Constitutional:      General: She is awake. She is not in acute distress.    Appearance: She is well-developed. She is not ill-appearing.  HENT:     Head: Normocephalic and atraumatic.     Right Ear: Hearing, tympanic membrane, ear canal and external ear normal. No drainage.     Left Ear: Hearing, tympanic membrane, ear canal and external ear normal. No drainage.     Nose: Nose normal.     Right Sinus: No maxillary sinus tenderness or frontal sinus tenderness.      Left Sinus: No maxillary sinus tenderness or frontal sinus tenderness.     Mouth/Throat:     Mouth: Mucous membranes are moist.     Pharynx: Oropharynx is clear. Uvula midline. No pharyngeal swelling, oropharyngeal  exudate or posterior oropharyngeal erythema.  Eyes:     General: Lids are normal.        Right eye: No discharge.        Left eye: No discharge.     Extraocular Movements: Extraocular movements intact.     Conjunctiva/sclera: Conjunctivae normal.     Pupils: Pupils are equal, round, and reactive to light.     Visual Fields: Right eye visual fields normal and left eye visual fields normal.  Neck:     Thyroid: No thyromegaly.     Vascular: No carotid bruit.     Trachea: Trachea normal.  Cardiovascular:     Rate and Rhythm: Normal rate and regular rhythm.     Heart sounds: Normal heart sounds. No murmur heard.   No gallop.  Pulmonary:     Effort: Pulmonary effort is normal. No accessory muscle usage or respiratory distress.     Breath sounds: Normal breath sounds.  Chest:  Breasts:    Right: Normal.     Left: Normal.  Abdominal:     General: Bowel sounds are normal.     Palpations: Abdomen is soft. There is no hepatomegaly or splenomegaly.     Tenderness: There is no abdominal tenderness.  Musculoskeletal:        General: Normal range of motion.     Cervical back: Normal range of motion and neck supple.     Right lower leg: No edema.     Left lower leg: No edema.  Lymphadenopathy:     Head:     Right side of head: No submental, submandibular, tonsillar, preauricular or posterior auricular adenopathy.     Left side of head: No submental, submandibular, tonsillar, preauricular or posterior auricular adenopathy.     Cervical: No cervical adenopathy.     Upper Body:     Right upper body: No supraclavicular, axillary or pectoral adenopathy.     Left upper body: No supraclavicular, axillary or pectoral adenopathy.  Skin:    General: Skin is warm and dry.     Capillary  Refill: Capillary refill takes less than 2 seconds.     Findings: No rash.  Neurological:     Mental Status: She is alert and oriented to person, place, and time.     Cranial Nerves: Cranial nerves are intact.     Gait: Gait is intact.     Deep Tendon Reflexes: Reflexes are normal and symmetric.     Reflex Scores:      Brachioradialis reflexes are 2+ on the right side and 2+ on the left side.      Patellar reflexes are 2+ on the right side and 2+ on the left side. Psychiatric:        Attention and Perception: Attention normal.        Mood and Affect: Mood normal.        Speech: Speech normal.        Behavior: Behavior normal. Behavior is cooperative.        Thought Content: Thought content normal.        Judgment: Judgment normal.    Results for orders placed or performed during the hospital encounter of [id number]  Basic metabolic panel  Result Value Ref Range   Sodium 137 135 - 145 mmol/L   Potassium 3.8 3.5 - 5.1 mmol/L   Chloride 103 98 - 111 mmol/L   CO2 24 22 - 32 mmol/L   Glucose, Bld 118 (H) 70 - 99 mg/dL  BUN 16 8 - 23 mg/dL   Creatinine, Ser 0.52 0.44 - 1.00 mg/dL   Calcium 9.4 8.9 - 10.3 mg/dL   GFR, Estimated >60 >60 mL/min   Anion gap 10 5 - 15  CBC  Result Value Ref Range   WBC 6.6 4.0 - 10.5 K/uL   RBC 5.03 3.87 - 5.11 MIL/uL   Hemoglobin 15.0 12.0 - 15.0 g/dL   HCT 43.0 36.0 - 46.0 %   MCV 85.5 80.0 - 100.0 fL   MCH 29.8 26.0 - 34.0 pg   MCHC 34.9 30.0 - 36.0 g/dL   RDW 12.6 11.5 - 15.5 %   Platelets 266 150 - 400 K/uL   nRBC 0.0 0.0 - 0.2 %      Assessment & Plan:   Problem List Items Addressed This Visit       Cardiovascular and Mediastinum   Hypertension    Chronic.  Controlled.  Continue with current medication regimen.  Labs ordered today.  Refills sent today.  Return to clinic in 6 months for reevaluation.  Call sooner if concerns arise.        Relevant Medications   losartan-hydrochlorothiazide (HYZAAR) 50-12.5 MG tablet   Atrial  fibrillation ([facility])    Chronic.  Controlled.  Continue with current medication regimen. Continue to follow up with Cardiology.  Labs ordered today.  Return to clinic in 6 months for reevaluation.  Call sooner if concerns arise.        Relevant Medications   losartan-hydrochlorothiazide (HYZAAR) 50-12.5 MG tablet     Other   Depression    Chronic.  Controlled.  Continue with current medication regimen.  Labs ordered today.  Refills sent.  Return to clinic in 6 months for reevaluation.  Call sooner if concerns arise.        Relevant Medications   citalopram (CELEXA) 20 MG tablet   Hyperlipidemia, mixed    Chronic.  Controlled.  Continue with current medication regimen.  Labs ordered today.  Will make recommendations based on lab results.  Return to clinic in 6 months for reevaluation.  Call sooner if concerns arise.        Relevant Medications   losartan-hydrochlorothiazide (HYZAAR) 50-12.5 MG tablet   Macular degeneration, wet ([facility])    Chronic.  Controlled.  Continue with current medication regimen. Only receiving injections every 8 weeks instead of every 4 now.  Return to clinic in 6 months for reevaluation.  Call sooner if concerns arise.        Other Visit Diagnoses     Annual physical exam    -  Primary   Health maintenance reviewed during visit today. Labs ordered today.     Relevant Orders   CBC with Differential/Platelet   Comprehensive metabolic panel   Lipid panel   TSH   Urinalysis, Routine w reflex microscopic        Follow up plan: Return in about 6 months (around 02/19/2022) for HTN, HLD, DM2 FU.   LABORATORY TESTING:  - Pap smear: not applicable  IMMUNIZATIONS:   - Tdap: Tetanus vaccination status reviewed: last tetanus booster within 10 years. - Influenza: Postponed to flu season - Pneumovax: Up to date - Prevnar: Up to date - HPV: Not applicable - Zostavax vaccine: Up to date  SCREENING: -Mammogram:  scheduled   - Colonoscopy: Up to date  -  Bone Density: Up to date  -Hearing Test: Not applicable  -Spirometry: Not applicable   PATIENT COUNSELING:   Advised to take  1 mg of folate supplement per day if capable of pregnancy.   Sexuality: Discussed sexually transmitted diseases, partner selection, use of condoms, avoidance of unintended pregnancy  and contraceptive alternatives.   Advised to avoid cigarette smoking.  I discussed with the patient that most people either abstain from alcohol or drink within safe limits (<=14/week and <=4 drinks/occasion for males, <=7/weeks and <= 3 drinks/occasion for females) and that the risk for alcohol disorders and other health effects rises proportionally with the number of drinks per week and how often a drinker exceeds daily limits.  Discussed cessation/primary prevention of drug use and availability of treatment for abuse.   Diet: Encouraged to adjust caloric intake to maintain  or achieve ideal body weight, to reduce intake of dietary saturated fat and total fat, to limit sodium intake by avoiding high sodium foods and not adding table salt, and to maintain adequate dietary potassium and calcium preferably from fresh fruits, vegetables, and low-fat dairy products.    stressed the importance of regular exercise  Injury prevention: Discussed safety belts, safety helmets, smoke detector, smoking near bedding or upholstery.   Dental health: Discussed importance of regular tooth brushing, flossing, and dental visits.    NEXT PREVENTATIVE PHYSICAL DUE IN 1 YEAR. Return in about 6 months (around 02/19/2022) for HTN, HLD, DM2 FU.

## 2021-08-22 NOTE — Assessment & Plan Note (Signed)
Chronic.  Controlled.  Continue with current medication regimen. Continue to follow up with Cardiology.  Labs ordered today.  Return to clinic in 6 months for reevaluation.  Call sooner if concerns arise.   

## 2021-08-22 NOTE — Assessment & Plan Note (Signed)
Chronic.  Controlled.  Continue with current medication regimen.  Labs ordered today.  Refills sent.  Return to clinic in 6 months for reevaluation.  Call sooner if concerns arise.  

## 2021-08-22 NOTE — Assessment & Plan Note (Signed)
Chronic.  Controlled.  Continue with current medication regimen. Only receiving injections every 8 weeks instead of every 4 now.  Return to clinic in 6 months for reevaluation.  Call sooner if concerns arise.

## 2021-08-22 NOTE — Assessment & Plan Note (Signed)
Chronic.  Controlled.  Continue with current medication regimen.  Labs ordered today.  Refills sent today.  Return to clinic in 6 months for reevaluation.  Call sooner if concerns arise.   

## 2021-08-22 NOTE — Assessment & Plan Note (Signed)
Chronic.  Controlled.  Continue with current medication regimen.  Labs ordered today.  Will make recommendations based on lab results.  Return to clinic in 6 months for reevaluation.  Call sooner if concerns arise.

## 2021-08-23 LAB — LIPID PANEL
Chol/HDL Ratio: 2.1 ratio (ref 0.0–4.4)
Cholesterol, Total: 118 mg/dL (ref 100–199)
HDL: 56 mg/dL (ref >39)
LDL Chol Calc (NIH): 45 mg/dL (ref 0–99)
Triglycerides: 90 mg/dL (ref 0–149)
VLDL Cholesterol Cal: 17 mg/dL (ref 5–40)

## 2021-08-23 LAB — CBC WITH DIFFERENTIAL/PLATELET
Basophils Absolute: 0 10*3/uL (ref 0.0–0.2)
Basos: 0 %
EOS (ABSOLUTE): 0.1 10*3/uL (ref 0.0–0.4)
Eos: 2 %
Hematocrit: 41.8 % (ref 34.0–46.6)
Hemoglobin: 14.8 g/dL (ref 11.1–15.9)
Immature Grans (Abs): 0 10*3/uL (ref 0.0–0.1)
Immature Granulocytes: 0 %
Lymphocytes Absolute: 1.9 10*3/uL (ref 0.7–3.1)
Lymphs: 28 %
MCH: 30.5 pg (ref 26.6–33.0)
MCHC: 35.4 g/dL (ref 31.5–35.7)
MCV: 86 fL (ref 79–97)
Monocytes Absolute: 0.6 10*3/uL (ref 0.1–0.9)
Monocytes: 9 %
Neutrophils Absolute: 4.2 10*3/uL (ref 1.4–7.0)
Neutrophils: 61 %
Platelets: 276 10*3/uL (ref 150–450)
RBC: 4.85 x10E6/uL (ref 3.77–5.28)
RDW: 12.8 % (ref 11.7–15.4)
WBC: 6.9 10*3/uL (ref 3.4–10.8)

## 2021-08-23 LAB — COMPREHENSIVE METABOLIC PANEL
ALT: 12 IU/L (ref 0–32)
AST: 16 IU/L (ref 0–40)
Albumin/Globulin Ratio: 2.3 — ABNORMAL HIGH (ref 1.2–2.2)
Albumin: 4.5 g/dL (ref 3.7–4.7)
Alkaline Phosphatase: 54 IU/L (ref 44–121)
BUN/Creatinine Ratio: 31 — ABNORMAL HIGH (ref 12–28)
BUN: 18 mg/dL (ref 8–27)
Bilirubin Total: 0.7 mg/dL (ref 0.0–1.2)
CO2: 23 mmol/L (ref 20–29)
Calcium: 9.6 mg/dL (ref 8.7–10.3)
Chloride: 102 mmol/L (ref 96–106)
Creatinine, Ser: 0.58 mg/dL (ref 0.57–1.00)
Globulin, Total: 2 g/dL (ref 1.5–4.5)
Glucose: 87 mg/dL (ref 65–99)
Potassium: 4.2 mmol/L (ref 3.5–5.2)
Sodium: 137 mmol/L (ref 134–144)
Total Protein: 6.5 g/dL (ref 6.0–8.5)
eGFR: 95 mL/min/{1.73_m2} (ref >59)

## 2021-08-23 LAB — TSH: TSH: 1.54 u[IU]/mL (ref 0.450–4.500)

## 2021-08-23 NOTE — Progress Notes (Signed)
Please let patient know that her lab work looks good.  No concerns at this time. Continue with current medication regimen.  Follow up as discussed.

## 2021-09-25 ENCOUNTER — Other Ambulatory Visit: Payer: Self-pay | Admitting: Nurse Practitioner

## 2021-09-27 DIAGNOSIS — H353221 Exudative age-related macular degeneration, left eye, with active choroidal neovascularization: Secondary | ICD-10-CM | POA: Diagnosis not present

## 2021-10-05 DIAGNOSIS — I480 Paroxysmal atrial fibrillation: Secondary | ICD-10-CM | POA: Diagnosis not present

## 2021-10-05 DIAGNOSIS — I10 Essential (primary) hypertension: Secondary | ICD-10-CM | POA: Diagnosis not present

## 2021-10-11 DIAGNOSIS — Z1231 Encounter for screening mammogram for malignant neoplasm of breast: Secondary | ICD-10-CM | POA: Diagnosis not present

## 2021-10-11 LAB — HM MAMMOGRAPHY

## 2021-11-20 DIAGNOSIS — H353221 Exudative age-related macular degeneration, left eye, with active choroidal neovascularization: Secondary | ICD-10-CM | POA: Diagnosis not present

## 2022-01-29 DIAGNOSIS — H353111 Nonexudative age-related macular degeneration, right eye, early dry stage: Secondary | ICD-10-CM | POA: Diagnosis not present

## 2022-01-29 DIAGNOSIS — H35371 Puckering of macula, right eye: Secondary | ICD-10-CM | POA: Diagnosis not present

## 2022-01-29 DIAGNOSIS — H269 Unspecified cataract: Secondary | ICD-10-CM | POA: Diagnosis not present

## 2022-01-29 DIAGNOSIS — H353220 Exudative age-related macular degeneration, left eye, stage unspecified: Secondary | ICD-10-CM | POA: Diagnosis not present

## 2022-01-29 DIAGNOSIS — H04123 Dry eye syndrome of bilateral lacrimal glands: Secondary | ICD-10-CM | POA: Diagnosis not present

## 2022-01-29 DIAGNOSIS — H527 Unspecified disorder of refraction: Secondary | ICD-10-CM | POA: Diagnosis not present

## 2022-02-19 ENCOUNTER — Ambulatory Visit (INDEPENDENT_AMBULATORY_CARE_PROVIDER_SITE_OTHER): Payer: Medicare Other | Admitting: Nurse Practitioner

## 2022-02-19 ENCOUNTER — Other Ambulatory Visit: Payer: Self-pay

## 2022-02-19 ENCOUNTER — Encounter: Payer: Self-pay | Admitting: Nurse Practitioner

## 2022-02-19 VITALS — BP 128/74 | HR 65 | Temp 97.6°F | Wt 168.2 lb

## 2022-02-19 DIAGNOSIS — I480 Paroxysmal atrial fibrillation: Secondary | ICD-10-CM | POA: Diagnosis not present

## 2022-02-19 DIAGNOSIS — I10 Essential (primary) hypertension: Secondary | ICD-10-CM | POA: Diagnosis not present

## 2022-02-19 DIAGNOSIS — F325 Major depressive disorder, single episode, in full remission: Secondary | ICD-10-CM

## 2022-02-19 DIAGNOSIS — H353230 Exudative age-related macular degeneration, bilateral, stage unspecified: Secondary | ICD-10-CM

## 2022-02-19 DIAGNOSIS — E782 Mixed hyperlipidemia: Secondary | ICD-10-CM | POA: Diagnosis not present

## 2022-02-19 MED ORDER — CETIRIZINE HCL 5 MG PO TABS: 5.0000 mg | ORAL_TABLET | Freq: Every day | ORAL | 1 refills | Status: DC

## 2022-02-19 NOTE — Assessment & Plan Note (Signed)
Chronic.  Controlled.  Continue with current medication regimen. Continue to follow up with Cardiology. Has an appointment coming up at the end of the month.  Will have an updated ECHO.  Labs ordered today.  Return to clinic in 6 months for reevaluation.  Call sooner if concerns arise.  ? ?

## 2022-02-19 NOTE — Assessment & Plan Note (Signed)
Chronic.  Controlled.  Continue with current medication regimen on Atorvastatin daily.  Labs ordered today.  Return to clinic in 6 months for reevaluation.  Call sooner if concerns arise.  ? ?

## 2022-02-19 NOTE — Progress Notes (Signed)
? ?BP 128/74   Pulse 65   Temp 97.6 ?F (36.4 ?C) (Oral)   Wt 168 lb 3.2 oz (76.3 kg)   LMP 01/03/1999 (Approximate)   SpO2 98%   BMI 26.66 kg/m?   ? ?Subjective:  ? ? Patient ID: Melanie Wallace, female    DOB: 1946-10-04, 76 y.o.   MRN: [id number] ? ?HPI: ?Melanie Wallace is a 76 y.o. female ? ?Chief Complaint  ?Patient presents with  ? Depression  ? Hyperlipidemia  ? Hypertension  ? ?HYPERTENSION ?Continues on Losartan-HCTZ 50-12.5 MG daily + Metoprolol 25 MG daily and Apixaban 5 MG BID -- for atrial fibrillation.  Atorvastatin for HLD, started 09/06/20 by cardiology -- she reports no ADR with this. ? ?She last saw cardiology at [facility], Dr. Sabra Heck, sees her in a couple of weeks.  Recent echo on 03/17/20 with EF >55% and mild to moderate mitral valve regurgitation. ?Hypertension status: controlled  ?Satisfied with current treatment? yes ?Duration of hypertension: chronic ?BP monitoring frequency:  a few times a week ?BP range: 120-130/70 at home ?BP medication side effects:  no ?Medication compliance: good compliance ?Aspirin: no ?Recurrent headaches: no ?Visual changes: no ?Palpitations: no ?Dyspnea: no ?Chest pain: no ?Lower extremity edema: no ?Dizzy/lightheaded: no  ?The ASCVD Risk score (Arnett DK, et al., 2019) failed to calculate for the following reasons: ?  The valid total cholesterol range is 130 to 320 mg/dL ? ? ?Patient has been seeing a specialist for Macular Degeneration.  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  She goes for an injection every 8 weeks.  ? ?MOOD ?Patient states it is well controlled on the Celexa.  Denies concerns at visit today.   ? ?[facility] Office Visit from 02/19/2022 in [facility]  ?PHQ-9 Total Score 0  ? ?  ? ? ? ?Relevant past medical, surgical, family and social history reviewed and updated as indicated. Interim medical history since our last visit reviewed. ?Allergies and medications reviewed and  updated. ? ?Review of Systems  ?Constitutional:  Negative for activity change, appetite change, diaphoresis, fatigue and fever.  ?Respiratory:  Negative for cough, chest tightness, shortness of breath and wheezing.   ?Cardiovascular:  Negative for chest pain, palpitations and leg swelling.  ?Gastrointestinal: Negative.   ?Neurological: Negative.   ?Psychiatric/Behavioral: Negative.    ? ?Per HPI unless specifically indicated above ? ?   ?Objective:  ?  ?BP 128/74   Pulse 65   Temp 97.6 ?F (36.4 ?C) (Oral)   Wt 168 lb 3.2 oz (76.3 kg)   LMP 01/03/1999 (Approximate)   SpO2 98%   BMI 26.66 kg/m?   ?Wt Readings from Last 3 Encounters:  ?02/19/22 168 lb 3.2 oz (76.3 kg)  ?08/22/21 160 lb 12.8 oz (72.9 kg)  ?05/18/21 162 lb (73.5 kg)  ?  ?Physical Exam ?Vitals and nursing note reviewed.  ?Constitutional:   ?   General: She is awake. She is not in acute distress. ?   Appearance: She is well-developed and well-groomed. She is not ill-appearing.  ?HENT:  ?   Head: Normocephalic.  ?   Right Ear: Hearing normal.  ?   Left Ear: Hearing normal.  ?Eyes:  ?   General: Lids are normal.     ?   Right eye: No discharge.     ?   Left eye: No discharge.  ?   Conjunctiva/sclera: Conjunctivae normal.  ?   Pupils: Pupils are  equal, round, and reactive to light.  ?Neck:  ?   Thyroid: No thyromegaly.  ?   Vascular: No carotid bruit.  ?Cardiovascular:  ?   Rate and Rhythm: Normal rate and regular rhythm.  ?   Heart sounds: Normal heart sounds. No murmur heard. ?  No gallop.  ?Pulmonary:  ?   Effort: Pulmonary effort is normal. No accessory muscle usage or respiratory distress.  ?   Breath sounds: Normal breath sounds.  ?Abdominal:  ?   General: Bowel sounds are normal. There is no distension.  ?   Palpations: Abdomen is soft. There is no hepatomegaly.  ?   Tenderness: There is no abdominal tenderness.  ?Musculoskeletal:  ?   Cervical back: Normal range of motion and neck supple.  ?   Right lower leg: No edema.  ?   Left lower leg: No  edema.  ?Lymphadenopathy:  ?   Cervical: No cervical adenopathy.  ?Skin: ?   General: Skin is warm and dry.  ?Neurological:  ?   Mental Status: She is alert and oriented to person, place, and time.  ?Psychiatric:     ?   Attention and Perception: Attention normal.     ?   Mood and Affect: Mood normal.     ?   Speech: Speech normal.     ?   Behavior: Behavior normal. Behavior is cooperative.     ?   Thought Content: Thought content normal.  ? ?Results for orders placed or performed in visit on 10/13/21  ?HM MAMMOGRAPHY  ?Result Value Ref Range  ? HM Mammogram 0-4 Bi-Rad 0-4 Bi-Rad, Self Reported Normal  ? ?   ?Assessment & Plan:  ? ?Problem List Items Addressed This Visit   ? ?  ? Cardiovascular and Mediastinum  ? Hypertension  ?  Chronic.  Controlled.  Continue with current medication regimen on Losartan, HCTZ and Metoprolol.  Labs ordered today.  Return to clinic in 6 months for reevaluation.  Call sooner if concerns arise.  ? ?  ?  ? Relevant Orders  ? Comp Met (CMET)  ? Atrial fibrillation ([place]) - Primary  ?  Chronic.  Controlled.  Continue with current medication regimen. Continue to follow up with Cardiology. Has an appointment coming up at the end of the month.  Will have an updated ECHO.  Labs ordered today.  Return to clinic in 6 months for reevaluation.  Call sooner if concerns arise.  ? ?  ?  ?  ? Other  ? Depression  ?  Chronic.  Controlled.  Continue with current medication regimen on celexa 44m.  Labs ordered today.  Return to clinic in 6 months for reevaluation.  Call sooner if concerns arise.  ? ?  ?  ? Hyperlipidemia, mixed  ?  Chronic.  Controlled.  Continue with current medication regimen on Atorvastatin daily.  Labs ordered today.  Return to clinic in 6 months for reevaluation.  Call sooner if concerns arise.  ? ?  ?  ? Relevant Orders  ? Lipid Profile  ? Macular degeneration, wet ([place]  ?  Chronic.  Controlled.  Continue with current medication regimen. Only receiving injections every 8 weeks.   Did not do well with every 10 weeks.  Continue to follow up with Dr. WGaylan Gerold  Return to clinic in 6 months for reevaluation.  Call sooner if concerns arise.  ? ?  ?  ?  ? ?Follow up plan: ?Return in about 6 months (around 08/22/2022)  for Physical and Fasting labs. ?

## 2022-02-19 NOTE — Assessment & Plan Note (Signed)
Chronic.  Controlled.  Continue with current medication regimen. Only receiving injections every 8 weeks.  Did not do well with every 10 weeks.  Continue to follow up with Dr. Willet.  Return to clinic in 6 months for reevaluation.  Call sooner if concerns arise.   

## 2022-02-19 NOTE — Assessment & Plan Note (Signed)
Chronic.  Controlled.  Continue with current medication regimen on Losartan, HCTZ and Metoprolol.  Labs ordered today.  Return to clinic in 6 months for reevaluation.  Call sooner if concerns arise.  ? ?

## 2022-02-19 NOTE — Assessment & Plan Note (Signed)
Chronic.  Controlled.  Continue with current medication regimen on celexa '20mg'$ .  Labs ordered today.  Return to clinic in 6 months for reevaluation.  Call sooner if concerns arise.  ? ?

## 2022-02-20 LAB — COMPREHENSIVE METABOLIC PANEL
ALT: 16 IU/L (ref 0–32)
AST: 15 IU/L (ref 0–40)
Albumin/Globulin Ratio: 2 (ref 1.2–2.2)
Albumin: 4.6 g/dL (ref 3.7–4.7)
Alkaline Phosphatase: 56 IU/L (ref 44–121)
BUN/Creatinine Ratio: 30 — ABNORMAL HIGH (ref 12–28)
BUN: 18 mg/dL (ref 8–27)
Bilirubin Total: 0.5 mg/dL (ref 0.0–1.2)
CO2: 23 mmol/L (ref 20–29)
Calcium: 9.6 mg/dL (ref 8.7–10.3)
Chloride: 100 mmol/L (ref 96–106)
Creatinine, Ser: 0.61 mg/dL (ref 0.57–1.00)
Globulin, Total: 2.3 g/dL (ref 1.5–4.5)
Glucose: 100 mg/dL — ABNORMAL HIGH (ref 70–99)
Potassium: 4.1 mmol/L (ref 3.5–5.2)
Sodium: 138 mmol/L (ref 134–144)
Total Protein: 6.9 g/dL (ref 6.0–8.5)
eGFR: 94 mL/min/{1.73_m2} (ref >59)

## 2022-02-20 LAB — LIPID PANEL
Chol/HDL Ratio: 2.6 ratio (ref 0.0–4.4)
Cholesterol, Total: 132 mg/dL (ref 100–199)
HDL: 50 mg/dL (ref >39)
LDL Chol Calc (NIH): 51 mg/dL (ref 0–99)
Triglycerides: 188 mg/dL — ABNORMAL HIGH (ref 0–149)
VLDL Cholesterol Cal: 31 mg/dL (ref 5–40)

## 2022-02-20 NOTE — Progress Notes (Signed)
Please let patient know that her lab work looks good.  No concerns at this time.  Follow up as discussed.

## 2022-03-06 ENCOUNTER — Ambulatory Visit (INDEPENDENT_AMBULATORY_CARE_PROVIDER_SITE_OTHER): Payer: Medicare Other | Admitting: Internal Medicine

## 2022-03-06 ENCOUNTER — Encounter: Payer: Self-pay | Admitting: Internal Medicine

## 2022-03-06 ENCOUNTER — Other Ambulatory Visit: Payer: Self-pay

## 2022-03-06 VITALS — BP 148/86 | HR 89 | Temp 99.0°F | Ht 66.61 in | Wt 163.0 lb

## 2022-03-06 DIAGNOSIS — F4321 Adjustment disorder with depressed mood: Secondary | ICD-10-CM | POA: Diagnosis not present

## 2022-03-06 DIAGNOSIS — F325 Major depressive disorder, single episode, in full remission: Secondary | ICD-10-CM

## 2022-03-06 MED ORDER — ZOLPIDEM TARTRATE 5 MG PO TABS: 5.0000 mg | ORAL_TABLET | Freq: Every evening | ORAL | 1 refills | Status: DC | PRN

## 2022-03-06 MED ORDER — CITALOPRAM HYDROBROMIDE 40 MG PO TABS: 40.0000 mg | ORAL_TABLET | Freq: Every day | ORAL | 1 refills | Status: DC

## 2022-03-06 NOTE — Progress Notes (Signed)
? ?BP (!) 148/86   Pulse 89   Temp 99 ?F (37.2 ?C) (Oral)   Ht 5' 6.61" (1.692 m)   Wt 163 lb (73.9 kg)   LMP 01/03/1999 (Approximate)   SpO2 97%   BMI 25.83 kg/m?   ? ?Subjective:  ? ? Patient ID: Melanie Wallace, female    DOB: May 29, 1946, 76 y.o.   MRN: [id number] ? ?Chief Complaint  ?Patient presents with  ?? Depression  ?  Patient states that she is in deep depression. States that she can't stop crying.  ? ? ?HPI: ?Melanie Wallace is a 76 y.o. female ? ?Pt is here for changes in her behaviour  ? ?Depression ?     The patient presents with depression.  This is a chronic (she has a ho depression her brother is on hospice care. her brother is all she has. was very protective of pt now she is very tearful and upset.) problem.  The current episode started in the past 7 days (foud out about his brother x last week they didnt tell her about her brothers diagnosis.).   Associated symptoms include decreased concentration, fatigue, helplessness, hopelessness, insomnia, irritable, restlessness, decreased interest, appetite change and sad.  Associated symptoms include no body aches, no myalgias, no headaches, no indigestion and no suicidal ideas.( Cannot sleep as she is ok and wakes up when her thoughts run)  Past medical history includes depression.   ? ?Chief Complaint  ?Patient presents with  ?? Depression  ?  Patient states that she is in deep depression. States that she can't stop crying.  ? ? ?Relevant past medical, surgical, family and social history reviewed and updated as indicated. Interim medical history since our last visit reviewed. ?Allergies and medications reviewed and updated. ? ?Review of Systems  ?Constitutional:  Positive for appetite change and fatigue.  ?Musculoskeletal:  Negative for myalgias.  ?Neurological:  Negative for headaches.  ?Psychiatric/Behavioral:  Positive for decreased concentration and depression. Negative for suicidal ideas. The patient has insomnia.   ? ?Per HPI unless  specifically indicated above ? ?   ?Objective:  ?  ?BP (!) 148/86   Pulse 89   Temp 99 ?F (37.2 ?C) (Oral)   Ht 5' 6.61" (1.692 m)   Wt 163 lb (73.9 kg)   LMP 01/03/1999 (Approximate)   SpO2 97%   BMI 25.83 kg/m?   ?Wt Readings from Last 3 Encounters:  ?03/06/22 163 lb (73.9 kg)  ?02/19/22 168 lb 3.2 oz (76.3 kg)  ?08/22/21 160 lb 12.8 oz (72.9 kg)  ?  ?Physical Exam ?Constitutional:   ?   General: She is irritable. She is not in acute distress. ?   Appearance: Normal appearance. She is not ill-appearing, toxic-appearing or diaphoretic.  ?Neurological:  ?   Mental Status: She is alert.  ?Psychiatric:  ?   Comments:  Is sad tearful and looks very emotional  ? ? ?Results for orders placed or performed in visit on 02/19/22  ?Comp Met (CMET)  ?Result Value Ref Range  ? Glucose 100 (H) 70 - 99 mg/dL  ? BUN 18 8 - 27 mg/dL  ? Creatinine, Ser 0.61 0.57 - 1.00 mg/dL  ? eGFR 94 >59 mL/min/1.73  ? BUN/Creatinine Ratio 30 (H) 12 - 28  ? Sodium 138 134 - 144 mmol/L  ? Potassium 4.1 3.5 - 5.2 mmol/L  ? Chloride 100 96 - 106 mmol/L  ? CO2 23 20 - 29 mmol/L  ? Calcium 9.6 8.7 - 10.3 mg/dL  ?  Total Protein 6.9 6.0 - 8.5 g/dL  ? Albumin 4.6 3.7 - 4.7 g/dL  ? Globulin, Total 2.3 1.5 - 4.5 g/dL  ? Albumin/Globulin Ratio 2.0 1.2 - 2.2  ? Bilirubin Total 0.5 0.0 - 1.2 mg/dL  ? Alkaline Phosphatase 56 44 - 121 IU/L  ? AST 15 0 - 40 IU/L  ? ALT 16 0 - 32 IU/L  ?Lipid Profile  ?Result Value Ref Range  ? Cholesterol, Total 132 100 - 199 mg/dL  ? Triglycerides 188 (H) 0 - 149 mg/dL  ? HDL 50 >39 mg/dL  ? VLDL Cholesterol Cal 31 5 - 40 mg/dL  ? LDL Chol Calc (NIH) 51 0 - 99 mg/dL  ? Chol/HDL Ratio 2.6 0.0 - 4.4 ratio  ? ?   ? ? ?Current Outpatient Medications:  ??  apixaban (ELIQUIS) 5 MG TABS tablet, Take 5 mg by mouth 2 (two) times daily., Disp: , Rfl:  ??  atorvastatin (LIPITOR) 10 MG tablet, Take 10 mg by mouth daily., Disp: , Rfl:  ??  calcium-vitamin D (OSCAL WITH D) 500-200 MG-UNIT per tablet, Take 1 tablet by mouth., Disp: ,  Rfl:  ??  cetirizine (ZYRTEC) 5 MG tablet, Take 1 tablet (5 mg total) by mouth daily., Disp: 90 tablet, Rfl: 1 ??  losartan-hydrochlorothiazide (HYZAAR) 50-12.5 MG tablet, Take 1 tablet by mouth daily., Disp: 90 tablet, Rfl: 4 ??  metoprolol succinate (TOPROL-XL) 25 MG 24 hr tablet, Take 25 mg by mouth daily., Disp: , Rfl:  ??  zolpidem (AMBIEN) 5 MG tablet, Take 1 tablet (5 mg total) by mouth at bedtime as needed for sleep., Disp: 15 tablet, Rfl: 1 ??  citalopram (CELEXA) 40 MG tablet, Take 1 tablet (40 mg total) by mouth daily., Disp: 30 tablet, Rfl: 1 ??  Multiple Vitamins-Minerals (PRESERVISION AREDS 2 PO), Take 1 tablet by mouth daily., Disp: , Rfl:   ? ? ?Assessment & Plan:  ?Depression / grief ?Wil increase celexa to 40 mg daily. ?Will need to fu with pcp if worsens.  ? ?Problem List Items Addressed This Visit   ? ?  ? Other  ? Depression - Primary  ? Relevant Medications  ? citalopram (CELEXA) 40 MG tablet  ? Other Relevant Orders  ? AMB Referral to [place]  ? Grief  ? Relevant Orders  ? AMB Referral to [place]  ?  ? ?Orders Placed This Encounter  ?Procedures  ?? AMB Referral to [facility]  ?  ? ?Meds ordered this encounter  ?Medications  ?? citalopram (CELEXA) 40 MG tablet  ?  Sig: Take 1 tablet (40 mg total) by mouth daily.  ?  Dispense:  30 tablet  ?  Refill:  1  ?  Discontinue 40 MG.  ?? zolpidem (AMBIEN) 5 MG tablet  ?  Sig: Take 1 tablet (5 mg total) by mouth at bedtime as needed for sleep.  ?  Dispense:  15 tablet  ?  Refill:  1  ?  ? ?Follow up plan: ?Return in about 2 weeks (around 03/20/2022). ? ? ?

## 2022-03-07 ENCOUNTER — Telehealth: Payer: Self-pay

## 2022-03-07 NOTE — Chronic Care Management (AMB) (Signed)
?  Chronic Care Management  ? ?Note ? ?03/07/2022 ?Name: Melanie Wallace MRN: [id number] DOB: February 20, 1946 ? ?Melanie Wallace is a 76 y.o. year old female who is a primary care patient of Jon Billings, NP. I reached out to [name] by phone today in response to a referral sent by [name] PCP. ? ?[name] was given information about Chronic Care Management services today including:  ?CCM service includes personalized support from designated clinical staff supervised by her physician, including individualized plan of care and coordination with other care providers ?24/7 contact phone numbers for assistance for urgent and routine care needs. ?Service will only be billed when office clinical staff spend 20 minutes or more in a month to coordinate care. ?Only one practitioner may furnish and bill the service in a calendar month. ?The patient may stop CCM services at any time (effective at the end of the month) by phone call to the office staff. ?The patient is responsible for co-pay (up to 20% after annual deductible is met) if co-pay is required by the individual health plan.  ? ?Patient agreed to services and verbal consent obtained.  ? ?Follow up plan: ?Telephone appointment with care management team member scheduled for:03/12/2022 ? ?Noreene Larsson, RMA ?Care Guide, Embedded Care Coordination ?[facility]  ?[place], [place] [address] ?Direct Dial: [phone] ?[email or web address] ?[email or web address]: [email or web address]  ? ?

## 2022-03-12 ENCOUNTER — Ambulatory Visit (INDEPENDENT_AMBULATORY_CARE_PROVIDER_SITE_OTHER): Payer: Medicare Other | Admitting: *Deleted

## 2022-03-12 DIAGNOSIS — F4321 Adjustment disorder with depressed mood: Secondary | ICD-10-CM

## 2022-03-12 DIAGNOSIS — F325 Major depressive disorder, single episode, in full remission: Secondary | ICD-10-CM

## 2022-03-12 DIAGNOSIS — I10 Essential (primary) hypertension: Secondary | ICD-10-CM

## 2022-03-12 NOTE — Patient Instructions (Addendum)
Visit Information ? ?Thank you for taking time to visit with me today. Please don't hesitate to contact me if I can be of assistance to you before our next scheduled telephone appointment. ? ?Following are the goals we discussed today:  ?- begin personal counseling ?- call and visit an old friend ?- talk about feelings with a friend, family or spiritual advisor ?- practice positive thinking and self-talk ? ?Our next appointment TBD ? ?Please call the care guide team at [phone] if you need to cancel or reschedule your appointment.  ? ?If you are experiencing a Mental Health or [place] or need someone to talk to, please call the Suicide and Crisis Lifeline: 988  ? ?Following is a copy of your full plan of care:  ?Care Plan : General Social Work (Adult)  ?Updates made by Melanie Claude, LCSW since 03/12/2022 12:00 AM  ?  ? ?Problem: CHL AMB "PATIENT-SPECIFIC PROBLEM"   ?Note:   ?CARE PLAN ENTRY ?(see longitudinal plan of care for additional care plan information) ? ?Current Barriers:  ?Knowledge deficits related to accessing mental health provider in patient with Depression  ?Patient is experiencing symptoms of  depression which seem to be exacerbated by recent death of her brother.     ?Patient needs Support, Education, and Care Coordination in order to meet unmet mental health needs  ?Mental Health Concerns  ? ?Clinical Social Work Goal(s):  ?Over the next 90 days, patient will work with SW bi-weekly by telephone or in person to reduce or manage symptoms of depression until connected for ongoing counseling resources.  ?Patient will implement clinical interventions discussed today to decreases symptoms of depression and increase knowledge and/or ability of: coping skills. ? ?Interventions:  ?Assessed patient's understanding, education, previous treatment and care coordination needs  ?Patient interviewed and appropriate assessments performed: PHQ 2 ?PHQ 9 ?Provided basic mental health support,  education and interventions  ?Patient discussed symptoms of grief related to recent loss of her brother, patient discussed history of crying spells and lack of sleep, however has recently improved with  dosage increase of Celexa -now '40mg'$  and sleep medication Zolpidem '5mg'$  ?Patient's feelings regarding loss processed ?Stages of grief reviewed with patient now moving towards acceptance ?Collaborated with appropriate clinical care team members regarding patient needs ?Discussed options for long term counseling based on need and insurance. Grief counseling recommended, however patient declined at this time-may re-consider after the funeral ?Reviewed mental health medications with patient prescribed by PCP and discussed compliance  ?Other interventions include: Depression screen reviewed  ?Grief response normalized ?Solution-Focused Strategies employed:  ?Active listening / Reflection utilized  ?Emotional Support Provided  ?Positive coping strategies explored ? ?Patient Self Care Activities & Deficits:  ?Patient is unable to independently navigate community resource options without care coordination support ?Patient is able to implement clinical interventions discussed today and is motivated for treatment  ?Patient will select one of the agencies from the list provided and call to schedule an appointment  ?Self administers medications as prescribed ?Attends all scheduled provider appointments ?Performs ADL's independently ?Performs IADL's independently ?Ability for insight ?Strong family or social support ? ?Initial goal documentation ? ?  ? ? ?Melanie Wallace was given information about Care Management services by the embedded care coordination team including:  ?Care Management services include personalized support from designated clinical staff supervised by her physician, including individualized plan of care and coordination with other care providers ?24/7 contact phone numbers for assistance for urgent and routine care  needs. ?The  patient may stop CCM services at any time (effective at the end of the month) by phone call to the office staff. ? ?Patient agreed to services and verbal consent obtained.  ? ?Patient verbalizes understanding of instructions and care plan provided today and agrees to view in [facility]. Active MyChart status confirmed with patient.   ? ?Telephone follow up appointment with care management team member scheduled for: To be scheduled by Careguide ? ?Melanie Regino, LCSW ?[facility] ?[phone] ? ? ?  ?

## 2022-03-12 NOTE — Chronic Care Management (AMB) (Signed)
?Chronic Care Management  ? ? Clinical Social Work Note ? ?03/12/2022 ?Name: [name] MRN: [id number] DOB: 01-Dec-1946 ? ?Melanie Wallace is a 76 y.o. year old female who is a primary care patient of Melanie Billings, NP. The CCM team was consulted to assist the patient with chronic disease management and/or care coordination needs related to: Mental Health Counseling and Resources.  ? ?Engaged with patient by telephone for initial visit in response to provider referral for social work chronic care management and care coordination services.  ? ?Consent to Services:  ?The patient was given the following information about Chronic Care Management services today, agreed to services, and gave verbal consent: 1. CCM service includes personalized support from designated clinical staff supervised by the primary care provider, including individualized plan of care and coordination with other care providers 2. 24/7 contact phone numbers for assistance for urgent and routine care needs. 3. Service will only be billed when office clinical staff spend 20 minutes or more in a month to coordinate care. 4. Only one practitioner may furnish and bill the service in a calendar month. 5.The patient may stop CCM services at any time (effective at the end of the month) by phone call to the office staff. 6. The patient will be responsible for cost sharing (co-pay) of up to 20% of the service fee (after annual deductible is met). Patient agreed to services and consent obtained. ? ?Patient agreed to services and consent obtained.  ? ?Assessment: Review of patient past medical history, allergies, medications, and health status, including review of relevant consultants reports was performed today as part of a comprehensive evaluation and provision of chronic care management and care coordination services.    ? ?SDOH (Social Determinants of Health) assessments and interventions performed:  ?SDOH Interventions   ? ?Flowsheet Row Most  Recent Value  ?SDOH Interventions   ?SDOH Interventions for the Following Domains Depression  ?Depression Interventions/Treatment  Medication, Counseling  ? ?  ?  ? ?Advanced Directives Status: Not addressed in this encounter. ? ?CCM Care Plan ? ?Allergies  ?Allergen Reactions  ? Sulfa Antibiotics Hives  ? Sulfasalazine Hives  ? Green Tea Leaf Ext Palpitations  ? Pen-Vee K [Penicillin V] Rash  ? Penicillin G Benzathine Rash  ? ? ?Outpatient Encounter Medications as of 03/12/2022  ?Medication Sig Note  ? apixaban (ELIQUIS) 5 MG TABS tablet Take 5 mg by mouth 2 (two) times daily. 08/11/2018: Being filled by cardiologist  ? atorvastatin (LIPITOR) 10 MG tablet Take 10 mg by mouth daily.   ? calcium-vitamin D (OSCAL WITH D) 500-200 MG-UNIT per tablet Take 1 tablet by mouth.   ? cetirizine (ZYRTEC) 5 MG tablet Take 1 tablet (5 mg total) by mouth daily.   ? citalopram (CELEXA) 40 MG tablet Take 1 tablet (40 mg total) by mouth daily.   ? losartan-hydrochlorothiazide (HYZAAR) 50-12.5 MG tablet Take 1 tablet by mouth daily.   ? metoprolol succinate (TOPROL-XL) 25 MG 24 hr tablet Take 25 mg by mouth daily. 08/11/2018: Being filled by Cardiologist  ? Multiple Vitamins-Minerals (PRESERVISION AREDS 2 PO) Take 1 tablet by mouth daily.   ? zolpidem (AMBIEN) 5 MG tablet Take 1 tablet (5 mg total) by mouth at bedtime as needed for sleep.   ? ?No facility-administered encounter medications on file as of 03/12/2022.  ? ? ?Patient Active Problem List  ? Diagnosis Date Noted  ? Grief 03/06/2022  ? 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 Internal hemorrhoids 12/05/2020  ?  Other thrombophilia ([place]) 12/02/2020  ? Hyperlipidemia, mixed 08/29/2020  ? Gastroesophageal reflux disease 05/04/2020  ? Atrial fibrillation ([place]) 02/04/2017  ? 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 History of cancer of vagina 07/14/2015  ? Allergic rhinitis 07/14/2015  ? Hypertension 07/14/2015  ? Depression 07/14/2015  ? History of breast cancer 04/13/2013   ? ? ?Conditions to be addressed/monitored: Depression; Mental Health Concerns  ? ?Care Plan : General Social Work (Adult)  ?Updates made by Vern Claude, LCSW since 03/12/2022 12:00 AM  ?  ? ?Problem: [facility] AMB "PATIENT-SPECIFIC PROBLEM"   ?Note:   ?CARE PLAN ENTRY ?(see longitudinal plan of care for additional care plan information) ? ?Current Barriers:  ?Knowledge deficits related to accessing mental health provider in patient with Depression  ?Patient is experiencing symptoms of  depression which seem to be exacerbated by recent death of her brother.     ?Patient needs Support, Education, and Care Coordination in order to meet unmet mental health needs  ?Mental Health Concerns  ? ?Clinical Social Work Goal(s):  ?Over the next 90 days, patient will work with SW bi-weekly by telephone or in person to reduce or manage symptoms of depression until connected for ongoing counseling resources.  ?Patient will implement clinical interventions discussed today to decreases symptoms of depression and increase knowledge and/or ability of: coping skills. ? ?Interventions:  ?Assessed patient's understanding, education, previous treatment and care coordination needs  ?Patient interviewed and appropriate assessments performed: PHQ 2 ?PHQ 9 ?Provided basic mental health support, education and interventions  ?Patient discussed symptoms of grief related to recent loss of her brother, patient discussed history of crying spells and lack of sleep, however has recently improved with  dosage increase of Celexa -now 13m and sleep medication Zolpidem 560m?Patient's feelings regarding loss processed ?Stages of grief reviewed with patient now moving towards acceptance ?Collaborated with appropriate clinical care team members regarding patient needs ?Discussed options for long term counseling based on need and insurance. Grief counseling recommended, however patient declined at this time-may re-consider after the funeral ?Reviewed mental  health medications with patient prescribed by PCP and discussed compliance  ?Other interventions include: Depression screen reviewed  ?Grief response normalized ?Solution-Focused Strategies employed:  ?Active listening / Reflection utilized  ?Emotional Support Provided  ?Positive coping strategies explored ? ?Patient Self Care Activities & Deficits:  ?Patient is unable to independently navigate community resource options without care coordination support ?Patient is able to implement clinical interventions discussed today and is motivated for treatment  ?Patient will select one of the agencies from the list provided and call to schedule an appointment  ?Self administers medications as prescribed ?Attends all scheduled provider appointments ?Performs ADL's independently ?Performs IADL's independently ?Ability for insight ?Strong family or social support ? ?Initial goal documentation ? ?  ?  ? ?Follow Up Plan: SW will follow up with patient by phone over the next 14 business days ?     ?Jaylend Reiland, LCSW ?[id number] ? ?

## 2022-03-16 DIAGNOSIS — F325 Major depressive disorder, single episode, in full remission: Secondary | ICD-10-CM

## 2022-03-16 DIAGNOSIS — I10 Essential (primary) hypertension: Secondary | ICD-10-CM

## 2022-03-16 DIAGNOSIS — F4321 Adjustment disorder with depressed mood: Secondary | ICD-10-CM

## 2022-03-21 ENCOUNTER — Ambulatory Visit: Payer: Medicare Other | Admitting: Nurse Practitioner

## 2022-03-26 DIAGNOSIS — H35371 Puckering of macula, right eye: Secondary | ICD-10-CM | POA: Diagnosis not present

## 2022-03-26 DIAGNOSIS — H2513 Age-related nuclear cataract, bilateral: Secondary | ICD-10-CM | POA: Diagnosis not present

## 2022-03-26 DIAGNOSIS — H353220 Exudative age-related macular degeneration, left eye, stage unspecified: Secondary | ICD-10-CM | POA: Diagnosis not present

## 2022-03-26 DIAGNOSIS — H353111 Nonexudative age-related macular degeneration, right eye, early dry stage: Secondary | ICD-10-CM | POA: Diagnosis not present

## 2022-03-28 ENCOUNTER — Other Ambulatory Visit: Payer: Self-pay | Admitting: Internal Medicine

## 2022-03-29 NOTE — Telephone Encounter (Signed)
Requested medications are due for refill today.  no ? ?Requested medications are on the active medications list.  yes ? ?Last refill. 03/06/2022 #30 1 refill ? ?Future visit scheduled.   yes ? ?Notes to clinic.  Refill request is a bit too soon - pharmacy requesting a 90 day supply. ? ? ? ?Requested Prescriptions  ?Pending Prescriptions Disp Refills  ? citalopram (CELEXA) 40 MG tablet [Pharmacy Med Name: CITALOPRAM HBR 40 MG TABLET] 90 tablet 1  ?  Sig: TAKE 1 TABLET BY MOUTH EVERY DAY  ?  ? Psychiatry:  Antidepressants - SSRI Passed - 03/28/2022  2:35 PM  ?  ?  Passed - Completed PHQ-2 or PHQ-9 in the last 360 days  ?  ?  Passed - Valid encounter within last 6 months  ?  Recent Outpatient Visits   ? ?      ? 3 weeks ago Major depressive disorder with single episode, in full remission ([place])  ? [facility] Vigg, [place], MD  ? 1 month ago Paroxysmal atrial fibrillation [facility])  ? [place], NP  ? 7 months ago Annual physical exam  ? [place], NP  ? 1 year ago Paroxysmal atrial fibrillation [facility])  ? [place], NP  ? 1 year ago Paroxysmal atrial fibrillation [facility])  ? [facility] Rib[place], Henrine Screws T, NP  ? ?  ?  ?Future Appointments   ? ?        ? In 1 week Jon Billings, NP [facility]  ? In 5 months Jon Billings, NP [facility], [facility]  ? ?  ? ?  ?  ?  ?  ?

## 2022-04-09 NOTE — Progress Notes (Signed)
? ?BP 121/67   Pulse (!) 59   Temp 98 ?F (36.7 ?C) (Oral)   Wt 162 lb 3.2 oz (73.6 kg)   LMP 01/03/1999 (Approximate)   SpO2 97%   BMI 25.70 kg/m?   ? ?Subjective:  ? ? Patient ID: Melanie Wallace, female    DOB: September 23, 1946, 76 y.o.   MRN: [id number] ? ?HPI: ?Melanie Wallace is a 76 y.o. female ? ?Chief Complaint  ?Patient presents with  ? Depression  ?  Brother passed away recently d/t cancer.   ? ?GRIEF/DEPRESSION ?Patient states she is doing a lot better since increasing the Celexa.  She was very close with her brother who passed away which is why it has been so hard for her.  She is concerned about the increased dose because she was told that she shouldn't increase more than 34m.  Denies Si.  She would like to continue the current dose.  ? ?  04/10/2022  ?  8:58 AM 03/06/2022  ?  3:02 PM 02/19/2022  ?  9:11 AM 02/26/2020  ?  9:55 AM  ?GAD 7 : Generalized Anxiety Score  ?Nervous, Anxious, on Edge 0 3 0 0  ?Control/stop worrying 0 3 0 0  ?Worry too much - different things 0 3 0 0  ?Trouble relaxing 0 3 0 0  ?Restless 0 2 0 0  ?Easily annoyed or irritable 0 1 0 0  ?Afraid - awful might happen 0 2 0 0  ?Total GAD 7 Score 0 17 0 0  ?Anxiety Difficulty Not difficult at all  Not difficult at all   ? ? ?[place] ?PHQ-9 Total Score 2  ? ?  ? ? ?Relevant past medical, surgical, family and social history reviewed and updated as indicated. Interim medical history since our last visit reviewed. ?Allergies and medications reviewed and updated. ? ?Review of Systems  ?Psychiatric/Behavioral:  Positive for dysphoric mood. Negative for suicidal ideas. The patient is nervous/anxious.   ? ?Per HPI unless specifically indicated above ? ?   ?Objective:  ?  ?BP 121/67   Pulse (!) 59   Temp 98 ?F (36.7 ?C) (Oral)   Wt 162 lb 3.2 oz (73.6 kg)   LMP 01/03/1999 (Approximate)   SpO2 97%   BMI 25.70 kg/m?   ?Wt Readings from Last 3 Encounters:  ?04/10/22 162 lb 3.2 oz  (73.6 kg)  ?03/06/22 163 lb (73.9 kg)  ?02/19/22 168 lb 3.2 oz (76.3 kg)  ?  ?Physical Exam ?Vitals and nursing note reviewed.  ?Constitutional:   ?   General: She is not in acute distress. ?   Appearance: Normal appearance. She is normal weight. She is not ill-appearing, toxic-appearing or diaphoretic.  ?HENT:  ?   Head: Normocephalic.  ?   Right Ear: External ear normal.  ?   Left Ear: External ear normal.  ?   Nose: Nose normal.  ?   Mouth/Throat:  ?   Mouth: Mucous membranes are moist.  ?   Pharynx: Oropharynx is clear.  ?Eyes:  ?   General:     ?   Right eye: No discharge.     ?   Left eye: No discharge.  ?   Extraocular Movements: Extraocular movements intact.  ?   Conjunctiva/sclera: Conjunctivae normal.  ?   Pupils: Pupils are equal, round, and reactive to light.  ?Cardiovascular:  ?   Rate and Rhythm: Normal rate and regular rhythm.  ?  Heart sounds: No murmur heard. ?Pulmonary:  ?   Effort: Pulmonary effort is normal. No respiratory distress.  ?   Breath sounds: Normal breath sounds. No wheezing or rales.  ?Musculoskeletal:  ?   Cervical back: Normal range of motion and neck supple.  ?Skin: ?   General: Skin is warm and dry.  ?   Capillary Refill: Capillary refill takes less than 2 seconds.  ?Neurological:  ?   General: No focal deficit present.  ?   Mental Status: She is alert and oriented to person, place, and time. Mental status is at baseline.  ?Psychiatric:     ?   Mood and Affect: Mood normal.     ?   Behavior: Behavior normal.     ?   Thought Content: Thought content normal.     ?   Judgment: Judgment normal.  ? ? ?Results for orders placed or performed in visit on 02/19/22  ?Comp Met (CMET)  ?Result Value Ref Range  ? Glucose 100 (H) 70 - 99 mg/dL  ? BUN 18 8 - 27 mg/dL  ? Creatinine, Ser 0.61 0.57 - 1.00 mg/dL  ? eGFR 94 >59 mL/min/1.73  ? BUN/Creatinine Ratio 30 (H) 12 - 28  ? Sodium 138 134 - 144 mmol/L  ? Potassium 4.1 3.5 - 5.2 mmol/L  ? Chloride 100 96 - 106 mmol/L  ? CO2 23 20 - 29 mmol/L   ? Calcium 9.6 8.7 - 10.3 mg/dL  ? Total Protein 6.9 6.0 - 8.5 g/dL  ? Albumin 4.6 3.7 - 4.7 g/dL  ? Globulin, Total 2.3 1.5 - 4.5 g/dL  ? Albumin/Globulin Ratio 2.0 1.2 - 2.2  ? Bilirubin Total 0.5 0.0 - 1.2 mg/dL  ? Alkaline Phosphatase 56 44 - 121 IU/L  ? AST 15 0 - 40 IU/L  ? ALT 16 0 - 32 IU/L  ?Lipid Profile  ?Result Value Ref Range  ? Cholesterol, Total 132 100 - 199 mg/dL  ? Triglycerides 188 (H) 0 - 149 mg/dL  ? HDL 50 >39 mg/dL  ? VLDL Cholesterol Cal 31 5 - 40 mg/dL  ? LDL Chol Calc (NIH) 51 0 - 99 mg/dL  ? Chol/HDL Ratio 2.6 0.0 - 4.4 ratio  ? ?   ?Assessment & Plan:  ? ?Problem List Items Addressed This Visit   ? ?  ? Other  ? Depression - Primary  ?  Chronic.  Improved.  Continue with current medication regimen of Celexa 65m daily.  Will allow patient to continue dose for 6 months.  Will discuss other options at that time.  Will keep follow up in September for reevaluation.  Will decrease dose of Celexa from 460mto 20 mg and add something to aid in keeping her depression under control.  Call sooner if concerns arise.  ? ? ?  ?  ? Relevant Medications  ? citalopram (CELEXA) 40 MG tablet  ?  ? ?Follow up plan: ?Return if symptoms worsen or fail to improve. ? ? ? ? ? ?

## 2022-04-10 ENCOUNTER — Ambulatory Visit (INDEPENDENT_AMBULATORY_CARE_PROVIDER_SITE_OTHER): Payer: Medicare Other | Admitting: Nurse Practitioner

## 2022-04-10 ENCOUNTER — Encounter: Payer: Self-pay | Admitting: Nurse Practitioner

## 2022-04-10 VITALS — BP 121/67 | HR 59 | Temp 98.0°F | Wt 162.2 lb

## 2022-04-10 DIAGNOSIS — F325 Major depressive disorder, single episode, in full remission: Secondary | ICD-10-CM

## 2022-04-10 MED ORDER — CITALOPRAM HYDROBROMIDE 40 MG PO TABS: 40.0000 mg | ORAL_TABLET | Freq: Every day | ORAL | 1 refills | Status: DC

## 2022-04-10 NOTE — Assessment & Plan Note (Addendum)
Chronic.  Improved.  Continue with current medication regimen of Celexa '40mg'$  daily.  Will allow patient to continue dose for 6 months.  Will discuss other options at that time.  Will keep follow up in September for reevaluation.  Will decrease dose of Celexa from '40mg'$  to 20 mg and add something to aid in keeping her depression under control.  Call sooner if concerns arise.  ? ?

## 2022-04-24 DIAGNOSIS — I272 Pulmonary hypertension, unspecified: Secondary | ICD-10-CM | POA: Diagnosis not present

## 2022-04-24 DIAGNOSIS — I480 Paroxysmal atrial fibrillation: Secondary | ICD-10-CM | POA: Diagnosis not present

## 2022-04-24 DIAGNOSIS — I10 Essential (primary) hypertension: Secondary | ICD-10-CM | POA: Diagnosis not present

## 2022-04-24 DIAGNOSIS — I071 Rheumatic tricuspid insufficiency: Secondary | ICD-10-CM | POA: Diagnosis not present

## 2022-04-30 DIAGNOSIS — Z7901 Long term (current) use of anticoagulants: Secondary | ICD-10-CM | POA: Diagnosis not present

## 2022-04-30 DIAGNOSIS — E785 Hyperlipidemia, unspecified: Secondary | ICD-10-CM | POA: Diagnosis not present

## 2022-04-30 DIAGNOSIS — Z79899 Other long term (current) drug therapy: Secondary | ICD-10-CM | POA: Diagnosis not present

## 2022-04-30 DIAGNOSIS — Z882 Allergy status to sulfonamides status: Secondary | ICD-10-CM | POA: Diagnosis not present

## 2022-04-30 DIAGNOSIS — I4891 Unspecified atrial fibrillation: Secondary | ICD-10-CM | POA: Diagnosis not present

## 2022-04-30 DIAGNOSIS — I081 Rheumatic disorders of both mitral and tricuspid valves: Secondary | ICD-10-CM | POA: Diagnosis not present

## 2022-04-30 DIAGNOSIS — I480 Paroxysmal atrial fibrillation: Secondary | ICD-10-CM | POA: Diagnosis not present

## 2022-04-30 DIAGNOSIS — Z880 Allergy status to penicillin: Secondary | ICD-10-CM | POA: Diagnosis not present

## 2022-04-30 DIAGNOSIS — R002 Palpitations: Secondary | ICD-10-CM | POA: Diagnosis not present

## 2022-04-30 DIAGNOSIS — Z87891 Personal history of nicotine dependence: Secondary | ICD-10-CM | POA: Diagnosis not present

## 2022-04-30 DIAGNOSIS — I10 Essential (primary) hypertension: Secondary | ICD-10-CM | POA: Diagnosis not present

## 2022-04-30 DIAGNOSIS — I4811 Longstanding persistent atrial fibrillation: Secondary | ICD-10-CM | POA: Diagnosis not present

## 2022-05-21 DIAGNOSIS — H353220 Exudative age-related macular degeneration, left eye, stage unspecified: Secondary | ICD-10-CM | POA: Diagnosis not present

## 2022-05-21 DIAGNOSIS — H353221 Exudative age-related macular degeneration, left eye, with active choroidal neovascularization: Secondary | ICD-10-CM | POA: Diagnosis not present

## 2022-05-21 DIAGNOSIS — H35371 Puckering of macula, right eye: Secondary | ICD-10-CM | POA: Diagnosis not present

## 2022-07-16 DIAGNOSIS — H353220 Exudative age-related macular degeneration, left eye, stage unspecified: Secondary | ICD-10-CM | POA: Diagnosis not present

## 2022-08-27 NOTE — Progress Notes (Unsigned)
LMP 01/03/1999 (Approximate)    Subjective:    Patient ID: Melanie Wallace, female    DOB: 11-10-1946, 77 y.o.   MRN: [id number]  HPI: Melanie Wallace is a 76 y.o. female presenting on 08/28/2022 for comprehensive medical examination. Current medical complaints include:none  She currently lives with: Menopausal Symptoms: no  HYPERTENSION Hypertension status: controlled  Satisfied with current treatment? no Duration of hypertension: years BP monitoring frequency:  daily BP range: 130/80 BP medication side effects:  no Medication compliance: excellent compliance Previous BP meds:HCTZ and losartan (cozaar) Aspirin:  Eliquis Recurrent headaches: no Visual changes: no Palpitations: no Dyspnea: no Chest pain: no Lower extremity edema: no Dizzy/lightheaded: no  DEPRESSION Depression Screen done today and results listed below:     04/10/2022    8:57 AM 03/12/2022    9:40 AM 03/06/2022    3:02 PM 02/19/2022    9:11 AM 08/22/2021   10:36 AM  Depression screen PHQ 2/9  Decreased Interest 0 1 3 0 0  Down, Depressed, Hopeless '1 1 3 ' 0 0  PHQ - 2 Score '1 2 6 ' 0 0  Altered sleeping 1 0 3 0 0  Tired, decreased energy 0 0 1 0 0  Change in appetite 0 0 1 0 0  Feeling bad or failure about yourself  0 0 3 0 0  Trouble concentrating 0 0 2 0 0  Moving slowly or fidgety/restless 0 0 0 0 0  Suicidal thoughts 0 0 0 0 0  PHQ-9 Score '2 2 16 ' 0 0  Difficult doing work/chores Not difficult at all   Not difficult at all Not difficult at all    The patient does not have a history of falls. I did complete a risk assessment for falls. A plan of care for falls was documented.   Past Medical History:  Past Medical History:  Diagnosis Date  . Allergic rhinitis   . Atrial fibrillation ([place])   . Breast CA ([place])   . Depression   . Hypertension   . Osteoporosis   . Vaginal cancer [facility])     Surgical History:  Past Surgical History:  Procedure Laterality Date  . BREAST SURGERY  03/2015  .  NASAL SEPTUM SURGERY    . TUBAL LIGATION      Medications:  Current Outpatient Medications on File Prior to Visit  Medication Sig  . apixaban (ELIQUIS) 5 MG TABS tablet Take 5 mg by mouth 2 (two) times daily.  Marland Kitchen atorvastatin (LIPITOR) 10 MG tablet Take 10 mg by mouth daily.  . calcium-vitamin D (OSCAL WITH D) 500-200 MG-UNIT per tablet Take 1 tablet by mouth.  . cetirizine (ZYRTEC) 5 MG tablet Take 1 tablet (5 mg total) by mouth daily.  . citalopram (CELEXA) 40 MG tablet Take 1 tablet (40 mg total) by mouth daily.  Marland Kitchen losartan-hydrochlorothiazide (HYZAAR) 50-12.5 MG tablet Take 1 tablet by mouth daily.  . metoprolol succinate (TOPROL-XL) 25 MG 24 hr tablet Take 25 mg by mouth daily.  . Multiple Vitamins-Minerals (PRESERVISION AREDS 2 PO) Take 1 tablet by mouth daily.  Marland Kitchen zolpidem (AMBIEN) 5 MG tablet Take 1 tablet (5 mg total) by mouth at bedtime as needed for sleep.   No current facility-administered medications on file prior to visit.    Allergies:  Allergies  Allergen Reactions  . Sulfa Antibiotics Hives  . Sulfasalazine Hives  . Green Tea Leaf Ext Palpitations  . Pen-Vee K [Penicillin V] Rash  . Penicillin G Benzathine Rash  Social History:  Social History   Socioeconomic History  . Marital status: Married    Spouse name: Not on file  . Number of children: Not on file  . Years of education: 62  . Highest education level: High school graduate  Occupational History  . Not on file  Tobacco Use  . Smoking status: Former  . Smokeless tobacco: Never  . Tobacco comments:    quit in 95- smoked for 3 years total   Vaping Use  . Vaping Use: Never used  Substance and Sexual Activity  . Alcohol use: No    Alcohol/week: 0.0 standard drinks of alcohol  . Drug use: No  . Sexual activity: Yes  Other Topics Concern  . Not on file  Social History Narrative  . Not on file   Social Determinants of Health   Financial Resource Strain: Low Risk  (02/05/2018)   Overall  Financial Resource Strain (CARDIA)   . Difficulty of Paying Living Expenses: Not hard at all  Food Insecurity: No Food Insecurity (03/12/2022)   Hunger Vital Sign   . Worried About Charity fundraiser in the Last Year: Never true   . Ran Out of Food in the Last Year: Never true  Transportation Needs: No Transportation Needs (02/05/2018)   PRAPARE - Transportation   . Lack of Transportation (Medical): No   . Lack of Transportation (Non-Medical): No  Physical Activity: Inactive (02/05/2018)   Exercise Vital Sign   . Days of Exercise per Week: 0 days   . Minutes of Exercise per Session: 0 min  Stress: No Stress Concern Present (02/05/2018)   [place]   . Feeling of Stress : Not at all  Social Connections: Moderately Integrated (02/05/2018)   Social Connection and Isolation Panel [NHANES]   . Frequency of Communication with Friends and Family: More than three times a week   . Frequency of Social Gatherings with Friends and Family: More than three times a week   . Attends Religious Services: More than 4 times per year   . Active Member of Clubs or Organizations: No   . Attends Archivist Meetings: Never   . Marital Status: Married  Human resources officer Violence: Not At Risk (02/05/2018)   Humiliation, Afraid, Rape, and Kick questionnaire   . Fear of Current or Ex-Partner: No   . Emotionally Abused: No   . Physically Abused: No   . Sexually Abused: No   Social History   Tobacco Use  Smoking Status Former  Smokeless Tobacco Never  Tobacco Comments   quit in 95- smoked for 3 years total    Social History   Substance and Sexual Activity  Alcohol Use No  . Alcohol/week: 0.0 standard drinks of alcohol    Family History:  Family History  Problem Relation Age of Onset  . Cancer Mother        breast and bone  . Hypertension Mother   . Cancer Father        prostate  . Hypertension Sister   . Cancer Brother    . Hypertension Daughter   . Hypothyroidism Daughter   . Allergies Daughter   . Hyperlipidemia Maternal Grandmother   . Heart attack Maternal Grandmother   . Stroke Paternal Grandfather     Past medical history, surgical history, medications, allergies, family history and social history reviewed with patient today and changes made to appropriate areas of the chart.   Review of Systems  Eyes:  Negative for blurred vision and double vision.  Respiratory:  Negative for shortness of breath.   Cardiovascular:  Negative for chest pain, palpitations and leg swelling.  Neurological:  Negative for dizziness and headaches.  All other ROS negative except what is listed above and in the HPI.      Objective:    LMP 01/03/1999 (Approximate)   Wt Readings from Last 3 Encounters:  04/10/22 162 lb 3.2 oz (73.6 kg)  03/06/22 163 lb (73.9 kg)  02/19/22 168 lb 3.2 oz (76.3 kg)    Physical Exam Vitals and nursing note reviewed.  Constitutional:      General: She is awake. She is not in acute distress.    Appearance: She is well-developed. She is not ill-appearing.  HENT:     Head: Normocephalic and atraumatic.     Right Ear: Hearing, tympanic membrane, ear canal and external ear normal. No drainage.     Left Ear: Hearing, tympanic membrane, ear canal and external ear normal. No drainage.     Nose: Nose normal.     Right Sinus: No maxillary sinus tenderness or frontal sinus tenderness.     Left Sinus: No maxillary sinus tenderness or frontal sinus tenderness.     Mouth/Throat:     Mouth: Mucous membranes are moist.     Pharynx: Oropharynx is clear. Uvula midline. No pharyngeal swelling, oropharyngeal exudate or posterior oropharyngeal erythema.  Eyes:     General: Lids are normal.        Right eye: No discharge.        Left eye: No discharge.     Extraocular Movements: Extraocular movements intact.     Conjunctiva/sclera: Conjunctivae normal.     Pupils: Pupils are equal, round, and  reactive to light.     Visual Fields: Right eye visual fields normal and left eye visual fields normal.  Neck:     Thyroid: No thyromegaly.     Vascular: No carotid bruit.     Trachea: Trachea normal.  Cardiovascular:     Rate and Rhythm: Normal rate and regular rhythm.     Heart sounds: Normal heart sounds. No murmur heard.    No gallop.  Pulmonary:     Effort: Pulmonary effort is normal. No accessory muscle usage or respiratory distress.     Breath sounds: Normal breath sounds.  Chest:  Breasts:    Right: Normal.     Left: Normal.  Abdominal:     General: Bowel sounds are normal.     Palpations: Abdomen is soft. There is no hepatomegaly or splenomegaly.     Tenderness: There is no abdominal tenderness.  Musculoskeletal:        General: Normal range of motion.     Cervical back: Normal range of motion and neck supple.     Right lower leg: No edema.     Left lower leg: No edema.  Lymphadenopathy:     Head:     Right side of head: No submental, submandibular, tonsillar, preauricular or posterior auricular adenopathy.     Left side of head: No submental, submandibular, tonsillar, preauricular or posterior auricular adenopathy.     Cervical: No cervical adenopathy.     Upper Body:     Right upper body: No supraclavicular, axillary or pectoral adenopathy.     Left upper body: No supraclavicular, axillary or pectoral adenopathy.  Skin:    General: Skin is warm and dry.     Capillary Refill: Capillary refill takes  less than 2 seconds.     Findings: No rash.  Neurological:     Mental Status: She is alert and oriented to person, place, and time.     Gait: Gait is intact.     Deep Tendon Reflexes: Reflexes are normal and symmetric.     Reflex Scores:      Brachioradialis reflexes are 2+ on the right side and 2+ on the left side.      Patellar reflexes are 2+ on the right side and 2+ on the left side. Psychiatric:        Attention and Perception: Attention normal.        Mood and  Affect: Mood normal.        Speech: Speech normal.        Behavior: Behavior normal. Behavior is cooperative.        Thought Content: Thought content normal.        Judgment: Judgment normal.    Results for orders placed or performed in visit on 02/19/22  Comp Met (CMET)  Result Value Ref Range   Glucose 100 (H) 70 - 99 mg/dL   BUN 18 8 - 27 mg/dL   Creatinine, Ser 0.61 0.57 - 1.00 mg/dL   eGFR 94 >59 mL/min/1.73   BUN/Creatinine Ratio 30 (H) 12 - 28   Sodium 138 134 - 144 mmol/L   Potassium 4.1 3.5 - 5.2 mmol/L   Chloride 100 96 - 106 mmol/L   CO2 23 20 - 29 mmol/L   Calcium 9.6 8.7 - 10.3 mg/dL   Total Protein 6.9 6.0 - 8.5 g/dL   Albumin 4.6 3.7 - 4.7 g/dL   Globulin, Total 2.3 1.5 - 4.5 g/dL   Albumin/Globulin Ratio 2.0 1.2 - 2.2   Bilirubin Total 0.5 0.0 - 1.2 mg/dL   Alkaline Phosphatase 56 44 - 121 IU/L   AST 15 0 - 40 IU/L   ALT 16 0 - 32 IU/L  Lipid Profile  Result Value Ref Range   Cholesterol, Total 132 100 - 199 mg/dL   Triglycerides 188 (H) 0 - 149 mg/dL   HDL 50 >39 mg/dL   VLDL Cholesterol Cal 31 5 - 40 mg/dL   LDL Chol Calc (NIH) 51 0 - 99 mg/dL   Chol/HDL Ratio 2.6 0.0 - 4.4 ratio      Assessment & Plan:   Problem List Items Addressed This Visit      Cardiovascular and Mediastinum   Hypertension - Primary   Atrial fibrillation (HCC)     Hematopoietic and Hemostatic   Other thrombophilia ([place])     Other   Depression   Macular degeneration, wet ([place])     Follow up plan: No follow-ups on file.   LABORATORY TESTING:  - Pap smear: not applicable  IMMUNIZATIONS:   - Tdap: Tetanus vaccination status reviewed: last tetanus booster within 10 years. - Influenza: Postponed to flu season - Pneumovax: Up to date - Prevnar: Up to date - HPV: Not applicable - Zostavax vaccine: Up to date  SCREENING: -Mammogram:  scheduled   - Colonoscopy: Up to date  - Bone Density: Up to date  -Hearing Test: Not applicable  -Spirometry: Not applicable    PATIENT COUNSELING:   Advised to take 1 mg of folate supplement per day if capable of pregnancy.   Sexuality: Discussed sexually transmitted diseases, partner selection, use of condoms, avoidance of unintended pregnancy  and contraceptive alternatives.   Advised to avoid cigarette smoking.  I discussed with  the patient that most people either abstain from alcohol or drink within safe limits (<=14/week and <=4 drinks/occasion for males, <=7/weeks and <= 3 drinks/occasion for females) and that the risk for alcohol disorders and other health effects rises proportionally with the number of drinks per week and how often a drinker exceeds daily limits.  Discussed cessation/primary prevention of drug use and availability of treatment for abuse.   Diet: Encouraged to adjust caloric intake to maintain  or achieve ideal body weight, to reduce intake of dietary saturated fat and total fat, to limit sodium intake by avoiding high sodium foods and not adding table salt, and to maintain adequate dietary potassium and calcium preferably from fresh fruits, vegetables, and low-fat dairy products.    stressed the importance of regular exercise  Injury prevention: Discussed safety belts, safety helmets, smoke detector, smoking near bedding or upholstery.   Dental health: Discussed importance of regular tooth brushing, flossing, and dental visits.    NEXT PREVENTATIVE PHYSICAL DUE IN 1 YEAR. No follow-ups on file.

## 2022-08-28 ENCOUNTER — Ambulatory Visit (INDEPENDENT_AMBULATORY_CARE_PROVIDER_SITE_OTHER): Payer: Medicare Other | Admitting: Nurse Practitioner

## 2022-08-28 ENCOUNTER — Encounter: Payer: Self-pay | Admitting: Nurse Practitioner

## 2022-08-28 VITALS — BP 127/78 | HR 65 | Temp 97.7°F | Ht 66.34 in | Wt 162.5 lb

## 2022-08-28 DIAGNOSIS — Z136 Encounter for screening for cardiovascular disorders: Secondary | ICD-10-CM | POA: Diagnosis not present

## 2022-08-28 DIAGNOSIS — D6869 Other thrombophilia: Secondary | ICD-10-CM | POA: Diagnosis not present

## 2022-08-28 DIAGNOSIS — Z0000 Encounter for general adult medical examination without abnormal findings: Secondary | ICD-10-CM

## 2022-08-28 DIAGNOSIS — F325 Major depressive disorder, single episode, in full remission: Secondary | ICD-10-CM

## 2022-08-28 DIAGNOSIS — Z1231 Encounter for screening mammogram for malignant neoplasm of breast: Secondary | ICD-10-CM | POA: Diagnosis not present

## 2022-08-28 DIAGNOSIS — H353230 Exudative age-related macular degeneration, bilateral, stage unspecified: Secondary | ICD-10-CM

## 2022-08-28 DIAGNOSIS — I10 Essential (primary) hypertension: Secondary | ICD-10-CM

## 2022-08-28 DIAGNOSIS — I480 Paroxysmal atrial fibrillation: Secondary | ICD-10-CM

## 2022-08-28 LAB — MICROSCOPIC EXAMINATION: Bacteria, UA: NONE SEEN

## 2022-08-28 LAB — URINALYSIS, ROUTINE W REFLEX MICROSCOPIC
Bilirubin, UA: NEGATIVE
Glucose, UA: NEGATIVE
Ketones, UA: NEGATIVE
Nitrite, UA: NEGATIVE
Protein,UA: NEGATIVE
Specific Gravity, UA: 1.02 (ref 1.005–1.030)
Urobilinogen, Ur: 0.2 mg/dL (ref 0.2–1.0)
pH, UA: 7 (ref 5.0–7.5)

## 2022-08-28 MED ORDER — LOSARTAN POTASSIUM-HCTZ 50-12.5 MG PO TABS: 1.0000 | ORAL_TABLET | Freq: Every day | ORAL | 4 refills | Status: DC

## 2022-08-28 MED ORDER — CETIRIZINE HCL 5 MG PO TABS
5.0000 mg | ORAL_TABLET | Freq: Every day | ORAL | 1 refills | Status: DC
Start: 2022-08-28 — End: 2023-02-26

## 2022-08-28 MED ORDER — CITALOPRAM HYDROBROMIDE 40 MG PO TABS: 40.0000 mg | ORAL_TABLET | Freq: Every day | ORAL | 1 refills | Status: DC

## 2022-08-28 NOTE — Assessment & Plan Note (Signed)
Chronic.  Controlled.  Continue with current medication regimen. Only receiving injections every 8 weeks.  Did not do well with every 10 weeks.  Continue to follow up with Dr. Gaylan Gerold.  Return to clinic in 6 months for reevaluation.  Call sooner if concerns arise.

## 2022-08-28 NOTE — Assessment & Plan Note (Signed)
With atrial fibrillation and Eliquis, check CBC today and monitor for bleeding.  Labs ordered today.

## 2022-08-28 NOTE — Assessment & Plan Note (Signed)
Chronic.  Controlled.  Continue with current medication regimen. Continue to follow up with Cardiology. Has an appointment coming up at the end of the month.   Labs ordered today.  Return to clinic in 6 months for reevaluation.  Call sooner if concerns arise.

## 2022-08-28 NOTE — Assessment & Plan Note (Signed)
Chronic.  Controlled.  Continue with current medication regimen of Celexa 40mg daily.  Refills sent today.  Labs ordered today.  Return to clinic in 6 months for reevaluation.  Call sooner if concerns arise.   

## 2022-08-28 NOTE — Assessment & Plan Note (Signed)
Chronic.  Controlled.  Continue with current medication regimen on Losartan, HCTZ and Metoprolol.  Refills sent today.  Labs ordered today.  Return to clinic in 6 months for reevaluation.  Call sooner if concerns arise.

## 2022-08-29 LAB — COMPREHENSIVE METABOLIC PANEL
ALT: 12 IU/L (ref 0–32)
AST: 18 IU/L (ref 0–40)
Albumin/Globulin Ratio: 2.1 (ref 1.2–2.2)
Albumin: 4.6 g/dL (ref 3.8–4.8)
Alkaline Phosphatase: 56 IU/L (ref 44–121)
BUN/Creatinine Ratio: 19 (ref 12–28)
BUN: 15 mg/dL (ref 8–27)
Bilirubin Total: 0.8 mg/dL (ref 0.0–1.2)
CO2: 24 mmol/L (ref 20–29)
Calcium: 9.9 mg/dL (ref 8.7–10.3)
Chloride: 100 mmol/L (ref 96–106)
Creatinine, Ser: 0.78 mg/dL (ref 0.57–1.00)
Globulin, Total: 2.2 g/dL (ref 1.5–4.5)
Glucose: 88 mg/dL (ref 70–99)
Potassium: 4.1 mmol/L (ref 3.5–5.2)
Sodium: 138 mmol/L (ref 134–144)
Total Protein: 6.8 g/dL (ref 6.0–8.5)
eGFR: 80 mL/min/{1.73_m2} (ref >59)

## 2022-08-29 LAB — CBC WITH DIFFERENTIAL/PLATELET
Basophils Absolute: 0 10*3/uL (ref 0.0–0.2)
Basos: 1 %
EOS (ABSOLUTE): 0.2 10*3/uL (ref 0.0–0.4)
Eos: 2 %
Hematocrit: 41.8 % (ref 34.0–46.6)
Hemoglobin: 13.9 g/dL (ref 11.1–15.9)
Immature Grans (Abs): 0 10*3/uL (ref 0.0–0.1)
Immature Granulocytes: 0 %
Lymphocytes Absolute: 1.9 10*3/uL (ref 0.7–3.1)
Lymphs: 28 %
MCH: 29.3 pg (ref 26.6–33.0)
MCHC: 33.3 g/dL (ref 31.5–35.7)
MCV: 88 fL (ref 79–97)
Monocytes Absolute: 0.5 10*3/uL (ref 0.1–0.9)
Monocytes: 8 %
Neutrophils Absolute: 4.1 10*3/uL (ref 1.4–7.0)
Neutrophils: 61 %
Platelets: 287 10*3/uL (ref 150–450)
RBC: 4.75 x10E6/uL (ref 3.77–5.28)
RDW: 12.8 % (ref 11.7–15.4)
WBC: 6.6 10*3/uL (ref 3.4–10.8)

## 2022-08-29 LAB — LIPID PANEL
Chol/HDL Ratio: 2.2 ratio (ref 0.0–4.4)
Cholesterol, Total: 124 mg/dL (ref 100–199)
HDL: 56 mg/dL (ref >39)
LDL Chol Calc (NIH): 48 mg/dL (ref 0–99)
Triglycerides: 111 mg/dL (ref 0–149)
VLDL Cholesterol Cal: 20 mg/dL (ref 5–40)

## 2022-08-29 LAB — TSH: TSH: 1.77 u[IU]/mL (ref 0.450–4.500)

## 2022-08-29 NOTE — Progress Notes (Signed)
Please let patient know that overall her lab work looks good.  No concerns at this time.  Follow up as discussed.  Continue with current medication regimen.

## 2022-09-10 DIAGNOSIS — H353220 Exudative age-related macular degeneration, left eye, stage unspecified: Secondary | ICD-10-CM | POA: Diagnosis not present

## 2022-09-10 DIAGNOSIS — H35371 Puckering of macula, right eye: Secondary | ICD-10-CM | POA: Diagnosis not present

## 2022-09-10 DIAGNOSIS — H353221 Exudative age-related macular degeneration, left eye, with active choroidal neovascularization: Secondary | ICD-10-CM | POA: Diagnosis not present

## 2022-09-10 DIAGNOSIS — H269 Unspecified cataract: Secondary | ICD-10-CM | POA: Diagnosis not present

## 2022-11-01 DIAGNOSIS — I071 Rheumatic tricuspid insufficiency: Secondary | ICD-10-CM | POA: Diagnosis not present

## 2022-11-01 DIAGNOSIS — I480 Paroxysmal atrial fibrillation: Secondary | ICD-10-CM | POA: Diagnosis not present

## 2022-11-01 DIAGNOSIS — I10 Essential (primary) hypertension: Secondary | ICD-10-CM | POA: Diagnosis not present

## 2022-11-02 DIAGNOSIS — Z1231 Encounter for screening mammogram for malignant neoplasm of breast: Secondary | ICD-10-CM | POA: Diagnosis not present

## 2022-11-02 LAB — HM MAMMOGRAPHY

## 2022-11-05 DIAGNOSIS — H3589 Other specified retinal disorders: Secondary | ICD-10-CM | POA: Diagnosis not present

## 2022-11-05 DIAGNOSIS — H353220 Exudative age-related macular degeneration, left eye, stage unspecified: Secondary | ICD-10-CM | POA: Diagnosis not present

## 2022-11-05 DIAGNOSIS — H35371 Puckering of macula, right eye: Secondary | ICD-10-CM | POA: Diagnosis not present

## 2022-11-06 ENCOUNTER — Other Ambulatory Visit: Payer: Self-pay

## 2023-01-02 DIAGNOSIS — H353221 Exudative age-related macular degeneration, left eye, with active choroidal neovascularization: Secondary | ICD-10-CM | POA: Diagnosis not present

## 2023-01-02 DIAGNOSIS — H353111 Nonexudative age-related macular degeneration, right eye, early dry stage: Secondary | ICD-10-CM | POA: Diagnosis not present

## 2023-01-23 ENCOUNTER — Other Ambulatory Visit: Payer: Self-pay | Admitting: Family

## 2023-01-23 ENCOUNTER — Ambulatory Visit
Admission: RE | Admit: 2023-01-23 | Discharge: 2023-01-23 | Disposition: A | Discharge status: Home / Self Care | Payer: Medicare Other | Source: Ambulatory Visit | Attending: Family | Admitting: Family

## 2023-01-23 ENCOUNTER — Telehealth: Payer: Self-pay | Admitting: Nurse Practitioner

## 2023-01-23 ENCOUNTER — Ambulatory Visit
Admission: RE | Admit: 2023-01-23 | Discharge: 2023-01-23 | Disposition: A | Discharge status: Home / Self Care | Payer: Medicare Other | Attending: Family | Admitting: Family

## 2023-01-23 DIAGNOSIS — M25572 Pain in left ankle and joints of left foot: Secondary | ICD-10-CM

## 2023-01-23 DIAGNOSIS — M19072 Primary osteoarthritis, left ankle and foot: Secondary | ICD-10-CM | POA: Diagnosis not present

## 2023-01-23 DIAGNOSIS — M7989 Other specified soft tissue disorders: Secondary | ICD-10-CM | POA: Diagnosis not present

## 2023-01-23 DIAGNOSIS — M7732 Calcaneal spur, left foot: Secondary | ICD-10-CM | POA: Diagnosis not present

## 2023-01-23 NOTE — Telephone Encounter (Signed)
Pharmacy request new meds LOSARTAN-HCTZ 50-12.5 MG TAB

## 2023-01-23 NOTE — Telephone Encounter (Signed)
Medication is not due for refill. Was sent 08/28/22 for #90 with 4 additional refills.

## 2023-02-25 NOTE — Progress Notes (Unsigned)
LMP 01/03/1999 (Approximate)    Subjective:    Patient ID: Melanie Wallace, female    DOB: 04/15/1946, 77 y.o.   MRN: [id number]  HPI: Melanie Wallace is a 77 y.o. female presenting on 02/26/2023 for comprehensive medical examination. Current medical complaints include:{Blank single:19197::"none","***"}  She currently lives with: Menopausal Symptoms: {Blank single:19197::"yes","no"}  HYPERTENSION Hypertension status: controlled  Satisfied with current treatment? no Duration of hypertension: years BP monitoring frequency:  daily BP range: 120/70 BP medication side effects:  no Medication compliance: excellent compliance Previous BP meds:HCTZ and losartan (cozaar), metoprolol Aspirin:  Eliquis Recurrent headaches: no Visual changes: no Palpitations: no Dyspnea: no Chest pain: no Lower extremity edema: no Dizzy/lightheaded: no   DEPRESSION Patient states she has been doing okay.  She cries once a month which is much better than it was before.  Feels like she is doing well.  Denies SI.    Functional Status Survey:       08/28/2022    9:19 AM 04/10/2022    8:57 AM 03/06/2022    3:02 PM 02/19/2022    9:09 AM 08/22/2021   10:35 AM  Fall Risk   Falls in the past year? 0 0 0 0 0  Number falls in past yr: 0 0 0 0 0  Injury with Fall? 0 0 0 0 0  Risk for fall due to : No Fall Risks No Fall Risks No Fall Risks No Fall Risks No Fall Risks  Follow up Falls evaluation completed Falls evaluation completed Falls evaluation completed Falls evaluation completed Falls evaluation completed    Depression Screen    08/28/2022    9:19 AM 04/10/2022    8:57 AM 03/12/2022    9:40 AM 03/06/2022    3:02 PM 02/19/2022    9:11 AM  Depression screen PHQ 2/9  Decreased Interest 0 0 1 3 0  Down, Depressed, Hopeless 0 '1 1 3 '$ 0  PHQ - 2 Score 0 '1 2 6 '$ 0  Altered sleeping 0 1 0 3 0  Tired, decreased energy 0 0 0 1 0  Change in appetite 0 0 0 1 0  Feeling bad or failure about yourself  0 0 0 3 0   Trouble concentrating 0 0 0 2 0  Moving slowly or fidgety/restless 0 0 0 0 0  Suicidal thoughts 0 0 0 0 0  PHQ-9 Score 0 '2 2 16 '$ 0  Difficult doing work/chores Not difficult at all Not difficult at all   Not difficult at all     Advanced Directives Does patient have a HCPOA?    {Blank single:19197::"yes","no"} If yes, name and contact information:  Does patient have a living will or MOST form?  {Blank single:19197::"yes","no"}  Past Medical History:  Past Medical History:  Diagnosis Date   Allergic rhinitis    Atrial fibrillation ([place])    Breast CA ([place])    Depression    Hypertension    Osteoporosis    Vaginal cancer [facility])     Surgical History:  Past Surgical History:  Procedure Laterality Date   BREAST SURGERY  03/2015   NASAL SEPTUM SURGERY     TUBAL LIGATION      Medications:  Current Outpatient Medications on File Prior to Visit  Medication Sig   apixaban (ELIQUIS) 5 MG TABS tablet Take 5 mg by mouth 2 (two) times daily.   atorvastatin (LIPITOR) 10 MG tablet Take 10 mg by mouth daily.   calcium-vitamin D (OSCAL WITH D) 500-200  MG-UNIT per tablet Take 1 tablet by mouth.   cetirizine (ZYRTEC) 5 MG tablet Take 1 tablet (5 mg total) by mouth daily.   citalopram (CELEXA) 40 MG tablet Take 1 tablet (40 mg total) by mouth daily.   losartan-hydrochlorothiazide (HYZAAR) 50-12.5 MG tablet Take 1 tablet by mouth daily.   metoprolol succinate (TOPROL-XL) 25 MG 24 hr tablet Take 25 mg by mouth daily.   Multiple Vitamins-Minerals (PRESERVISION AREDS 2 PO) Take 1 tablet by mouth daily.   No current facility-administered medications on file prior to visit.    Allergies:  Allergies  Allergen Reactions   Sulfa Antibiotics Hives   Sulfasalazine Hives   Green Tea Leaf Ext Palpitations   Pen-Vee K [Penicillin V] Rash   Penicillin G Benzathine Rash    Social History:  Social History   Socioeconomic History   Marital status: Married    Spouse name: Not on file   Number  of children: Not on file   Years of education: 12   Highest education level: High school graduate  Occupational History   Not on file  Tobacco Use   Smoking status: Former   Smokeless tobacco: Never   Tobacco comments:    quit in 95- smoked for 3 years total   Vaping Use   Vaping Use: Never used  Substance and Sexual Activity   Alcohol use: No    Alcohol/week: 0.0 standard drinks of alcohol   Drug use: No   Sexual activity: Yes  Other Topics Concern   Not on file  Social History Narrative   Not on file   Social Determinants of Health   Financial Resource Strain: Low Risk  (02/05/2018)   Overall Financial Resource Strain (CARDIA)    Difficulty of Paying Living Expenses: Not hard at all  Food Insecurity: No Food Insecurity (03/12/2022)   Hunger Vital Sign    Worried About Running Out of Food in the Last Year: Never true    [place] in the Last Year: Never true  Transportation Needs: No Transportation Needs (02/05/2018)   PRAPARE - Hydrologist (Medical): No    Lack of Transportation (Non-Medical): No  Physical Activity: Inactive (02/05/2018)   Exercise Vital Sign    Days of Exercise per Week: 0 days    Minutes of Exercise per Session: 0 min  Stress: No Stress Concern Present (02/05/2018)   [place]    Feeling of Stress : Not at all  Social Connections: Moderately Integrated (02/05/2018)   Social Connection and Isolation Panel [NHANES]    Frequency of Communication with Friends and Family: More than three times a week    Frequency of Social Gatherings with Friends and Family: More than three times a week    Attends Religious Services: More than 4 times per year    Active Member of [employer] or Organizations: No    Attends Archivist Meetings: Never    Marital Status: Married  Human resources officer Violence: Not At Risk (02/05/2018)   Humiliation, Afraid, Rape, and Kick  questionnaire    Fear of Current or Ex-Partner: No    Emotionally Abused: No    Physically Abused: No    Sexually Abused: No   Social History   Tobacco Use  Smoking Status Former  Smokeless Tobacco Never  Tobacco Comments   quit in 95- smoked for 3 years total    Social History   Substance and Sexual  Activity  Alcohol Use No   Alcohol/week: 0.0 standard drinks of alcohol    Family History:  Family History  Problem Relation Age of Onset   Cancer Mother        breast and bone   Hypertension Mother    Cancer Father        prostate   Hypertension Sister    Cancer Brother    Hypertension Daughter    Hypothyroidism Daughter    Allergies Daughter    Hyperlipidemia Maternal Grandmother    Heart attack Maternal Grandmother    Stroke Paternal Grandfather     Past medical history, surgical history, medications, allergies, family history and social history reviewed with patient today and changes made to appropriate areas of the chart.   ROS  All other ROS negative except what is listed above and in the HPI.      Objective:    LMP 01/03/1999 (Approximate)   Wt Readings from Last 3 Encounters:  08/28/22 162 lb 8 oz (73.7 kg)  04/10/22 162 lb 3.2 oz (73.6 kg)  03/06/22 163 lb (73.9 kg)    No results found.  Physical Exam     02/05/2018    9:43 AM  6CIT Screen  What Year? 0 points  What month? 0 points  What time? 0 points  Count back from 20 0 points  Months in reverse 0 points  Repeat phrase 0 points  Total Score 0 points    Cognitive Testing - 6-CIT  Correct? Score   What year is it? {YES NO:22349} {Numbers; 0-4:31231} Yes = 0    No = 4  What month is it? {YES NO:22349} {Numbers; 0-4:31231} Yes = 0    No = 3  Remember:     Pia Mau, [place], [place]     What time is it? {YES NO:22349} {Numbers; 0-4:31231} Yes = 0    No = 3  Count backwards from 20 to 1 {YES NO:22349} {Numbers; 0-4:31231} Correct = 0    1 error = 2   More than 1 error = 4  Say  the months of the year in reverse. {YES NO:22349} {Numbers; 0-4:31231} Correct = 0    1 error = 2   More than 1 error = 4  What address did I ask you to remember? {YES NO:22349} {NUMBERS; 0-10:5044} Correct = 0  1 error = 2    2 error = 4    3 error = 6    4 error = 8    All wrong = 10       TOTAL SCORE  {Numbers; [id number]   Interpretation:  {Desc; normal/abnormal:11317::"Normal"}  Normal (0-7) Abnormal (8-28)   Results for orders placed or performed in visit on 11/06/22  HM MAMMOGRAPHY  Result Value Ref Range   HM Mammogram 0-4 Bi-Rad 0-4 Bi-Rad, Self Reported Normal      Assessment & Plan:   Problem List Items Addressed This Visit       Cardiovascular and Mediastinum   Hypertension   Atrial fibrillation ([place]) - Primary     Other   Hyperlipidemia, mixed   Macular degeneration, wet ([place])     Preventative Services:  AAA screening:  Health Risk Assessment and Personalized Prevention Plan: Bone Mass Measurements: Breast Cancer Screening: CVD Screening:  Cervical Cancer Screening: Colon Cancer Screening:  Depression Screening:  Diabetes Screening:  Glaucoma Screening:  Hepatitis B vaccine: Hepatitis C screening:  HIV Screening: Flu Vaccine: Lung cancer Screening: Obesity  Screening:  Pneumonia Vaccines (2): STI Screening:  Follow up plan: No follow-ups on file.   LABORATORY TESTING:  - Pap smear: {Blank [id number] done","not applicable","up to date","done elsewhere"}  IMMUNIZATIONS:   - Tdap: Tetanus vaccination status reviewed: {tetanus status:315746}. - Influenza: {Blank single:19197::"Up to date","Administered today","Postponed to flu season","Refused","Given elsewhere"} - Pneumovax: {Blank single:19197::"Up to date","Administered today","Not applicable","Refused","Given elsewhere"} - Prevnar: {Blank single:19197::"Up to date","Administered today","Not applicable","Refused","Given elsewhere"} - Zostavax vaccine: {Blank single:19197::"Up to  date","Administered today","Not applicable","Refused","Given elsewhere"}  SCREENING: -Mammogram: {Blank single:19197::"Up to date","Ordered today","Not applicable","Refused","Done elsewhere"}  - Colonoscopy: {Blank single:19197::"Up to date","Ordered today","Not applicable","Refused","Done elsewhere"}  - Bone Density: {Blank single:19197::"Up to date","Ordered today","Not applicable","Refused","Done elsewhere"}  -Hearing Test: {Blank single:19197::"Up to date","Ordered today","Not applicable","Refused","Done elsewhere"}  -Spirometry: {Blank single:19197::"Up to date","Ordered today","Not applicable","Refused","Done elsewhere"}   PATIENT COUNSELING:   Advised to take 1 mg of folate supplement per day if capable of pregnancy.   Sexuality: Discussed sexually transmitted diseases, partner selection, use of condoms, avoidance of unintended pregnancy  and contraceptive alternatives.   Advised to avoid cigarette smoking.  I discussed with the patient that most people either abstain from alcohol or drink within safe limits (<=14/week and <=4 drinks/occasion for males, <=7/weeks and <= 3 drinks/occasion for females) and that the risk for alcohol disorders and other health effects rises proportionally with the number of drinks per week and how often a drinker exceeds daily limits.  Discussed cessation/primary prevention of drug use and availability of treatment for abuse.   Diet: Encouraged to adjust caloric intake to maintain  or achieve ideal body weight, to reduce intake of dietary saturated fat and total fat, to limit sodium intake by avoiding high sodium foods and not adding table salt, and to maintain adequate dietary potassium and calcium preferably from fresh fruits, vegetables, and low-fat dairy products.    stressed the importance of regular exercise  Injury prevention: Discussed safety belts, safety helmets, smoke detector, smoking near bedding or upholstery.   Dental health: Discussed  importance of regular tooth brushing, flossing, and dental visits.    NEXT PREVENTATIVE PHYSICAL DUE IN 1 YEAR. No follow-ups on file.

## 2023-02-26 ENCOUNTER — Telehealth: Payer: Self-pay | Admitting: Nurse Practitioner

## 2023-02-26 ENCOUNTER — Encounter: Payer: Self-pay | Admitting: Nurse Practitioner

## 2023-02-26 ENCOUNTER — Ambulatory Visit (INDEPENDENT_AMBULATORY_CARE_PROVIDER_SITE_OTHER): Payer: Medicare Other | Admitting: Nurse Practitioner

## 2023-02-26 VITALS — BP 132/75 | HR 64 | Temp 97.5°F | Ht 66.0 in | Wt 165.9 lb

## 2023-02-26 DIAGNOSIS — E782 Mixed hyperlipidemia: Secondary | ICD-10-CM

## 2023-02-26 DIAGNOSIS — Z0000 Encounter for general adult medical examination without abnormal findings: Secondary | ICD-10-CM

## 2023-02-26 DIAGNOSIS — M25472 Effusion, left ankle: Secondary | ICD-10-CM

## 2023-02-26 DIAGNOSIS — Z7189 Other specified counseling: Secondary | ICD-10-CM

## 2023-02-26 DIAGNOSIS — D6869 Other thrombophilia: Secondary | ICD-10-CM

## 2023-02-26 DIAGNOSIS — H353230 Exudative age-related macular degeneration, bilateral, stage unspecified: Secondary | ICD-10-CM | POA: Diagnosis not present

## 2023-02-26 DIAGNOSIS — I10 Essential (primary) hypertension: Secondary | ICD-10-CM | POA: Diagnosis not present

## 2023-02-26 DIAGNOSIS — F325 Major depressive disorder, single episode, in full remission: Secondary | ICD-10-CM

## 2023-02-26 DIAGNOSIS — I480 Paroxysmal atrial fibrillation: Secondary | ICD-10-CM

## 2023-02-26 MED ORDER — CITALOPRAM HYDROBROMIDE 40 MG PO TABS: 40.0000 mg | ORAL_TABLET | Freq: Every day | ORAL | 1 refills | Status: DC

## 2023-02-26 MED ORDER — CETIRIZINE HCL 5 MG PO TABS: 5.0000 mg | ORAL_TABLET | Freq: Every day | ORAL | 1 refills | Status: DC

## 2023-02-26 NOTE — Telephone Encounter (Signed)
Pt called and reports that she is NOT out of her blood pressure medication, and will not be out for a couple of months. But she says because of all the stress in her life she wants it called in now. I advised patient to call when she is closer to running out because she still has refills at the pharmacy and she said she wants it called in now because that's what her PCP normally does for her after a visit  losartan-hydrochlorothiazide (HYZAAR) 50-12.5 MG tablet  [id number] [place] [place] [id number] Phone: [phone]: [phone]

## 2023-02-26 NOTE — Assessment & Plan Note (Signed)
Chronic.  Controlled.  Continue with current medication regimen. Continue to follow up with Cardiology.  Labs ordered today.  Return to clinic in 6 months for reevaluation.  Call sooner if concerns arise.   

## 2023-02-26 NOTE — Assessment & Plan Note (Signed)
Chronic.  Controlled.  Continue with current medication regimen of Celexa daily.  Refills sent today.  Labs ordered today.  Return to clinic in 6 months for reevaluation.  Call sooner if concerns arise.

## 2023-02-26 NOTE — Assessment & Plan Note (Signed)
Chronic.  Controlled.  Continue with current medication regimen on Atorvastatin daily.  Refills sent today.  Labs ordered today.  Return to clinic in 6 months for reevaluation.  Call sooner if concerns arise.   

## 2023-02-26 NOTE — Addendum Note (Signed)
Addended by: Jon Billings on: 02/26/2023 02:28 PM   Modules accepted: Level of Service

## 2023-02-26 NOTE — Assessment & Plan Note (Addendum)
A voluntary discussion about advance care planning including the explanation and discussion of advance directives was extensively discussed  with the patient for 5 minutes with patient.  Explanation about the health care proxy and Living will was reviewed and packet with forms with explanation of how to fill them out was given.  During this discussion, the patient was able to identify a health care proxy as her daughter and plans to fill out the paperwork required.  Patient was offered a separate [place] visit for further assistance with forms.

## 2023-02-26 NOTE — Assessment & Plan Note (Signed)
Chronic.  Controlled.  Continue with current medication regimen on Losartan, HCTZ and Metoprolol.  Checks blood pressure at home and runs 120/70s.  Refills sent today.  Labs ordered today.  Return to clinic in 6 months for reevaluation.  Call sooner if concerns arise.

## 2023-02-26 NOTE — Assessment & Plan Note (Signed)
Chronic.  With atrial fibrillation and Eliquis, check CBC today and monitor for bleeding.  Labs ordered today.

## 2023-02-26 NOTE — Telephone Encounter (Signed)
[facility] called and spoke to Jerome, [facility] about the refill(s) losartan-HCTZ requested. Advised it was sent on 08/28/22 #90/4 refill(s). He says she picked up a 3 month supply on 01/28/23, so she is 2 months early. Patient called and advised of the above, she verbalized understanding and just wanted to make sure she has some available at the pharmacy.

## 2023-02-26 NOTE — Assessment & Plan Note (Signed)
Chronic.  Controlled.  Continue with current medication regimen. Only receiving injections every 8 weeks.  Continue to follow up with Dr. Gaylan Gerold.  Denies concerns at visit today.  Return to clinic in 6 months for reevaluation.  Call sooner if concerns arise.

## 2023-02-27 DIAGNOSIS — H353222 Exudative age-related macular degeneration, left eye, with inactive choroidal neovascularization: Secondary | ICD-10-CM | POA: Diagnosis not present

## 2023-02-27 DIAGNOSIS — H353220 Exudative age-related macular degeneration, left eye, stage unspecified: Secondary | ICD-10-CM | POA: Diagnosis not present

## 2023-02-27 DIAGNOSIS — H353111 Nonexudative age-related macular degeneration, right eye, early dry stage: Secondary | ICD-10-CM | POA: Diagnosis not present

## 2023-02-27 DIAGNOSIS — H35371 Puckering of macula, right eye: Secondary | ICD-10-CM | POA: Diagnosis not present

## 2023-02-27 LAB — COMPREHENSIVE METABOLIC PANEL
ALT: 12 IU/L (ref 0–32)
AST: 15 IU/L (ref 0–40)
Albumin/Globulin Ratio: 2.1 (ref 1.2–2.2)
Albumin: 4.6 g/dL (ref 3.8–4.8)
Alkaline Phosphatase: 62 IU/L (ref 44–121)
BUN/Creatinine Ratio: 21 (ref 12–28)
BUN: 14 mg/dL (ref 8–27)
Bilirubin Total: 0.6 mg/dL (ref 0.0–1.2)
CO2: 24 mmol/L (ref 20–29)
Calcium: 9.6 mg/dL (ref 8.7–10.3)
Chloride: 103 mmol/L (ref 96–106)
Creatinine, Ser: 0.68 mg/dL (ref 0.57–1.00)
Globulin, Total: 2.2 g/dL (ref 1.5–4.5)
Glucose: 93 mg/dL (ref 70–99)
Potassium: 4.2 mmol/L (ref 3.5–5.2)
Sodium: 142 mmol/L (ref 134–144)
Total Protein: 6.8 g/dL (ref 6.0–8.5)
eGFR: 91 mL/min/{1.73_m2} (ref >59)

## 2023-02-27 LAB — LIPID PANEL
Chol/HDL Ratio: 2.3 ratio (ref 0.0–4.4)
Cholesterol, Total: 140 mg/dL (ref 100–199)
HDL: 60 mg/dL (ref >39)
LDL Chol Calc (NIH): 64 mg/dL (ref 0–99)
Triglycerides: 81 mg/dL (ref 0–149)
VLDL Cholesterol Cal: 16 mg/dL (ref 5–40)

## 2023-02-27 LAB — CBC WITH DIFFERENTIAL/PLATELET
Basophils Absolute: 0 10*3/uL (ref 0.0–0.2)
Basos: 0 %
EOS (ABSOLUTE): 0.2 10*3/uL (ref 0.0–0.4)
Eos: 2 %
Hematocrit: 41.5 % (ref 34.0–46.6)
Hemoglobin: 14.1 g/dL (ref 11.1–15.9)
Immature Grans (Abs): 0 10*3/uL (ref 0.0–0.1)
Immature Granulocytes: 0 %
Lymphocytes Absolute: 1.6 10*3/uL (ref 0.7–3.1)
Lymphs: 22 %
MCH: 30.7 pg (ref 26.6–33.0)
MCHC: 34 g/dL (ref 31.5–35.7)
MCV: 90 fL (ref 79–97)
Monocytes Absolute: 0.6 10*3/uL (ref 0.1–0.9)
Monocytes: 8 %
Neutrophils Absolute: 4.8 10*3/uL (ref 1.4–7.0)
Neutrophils: 68 %
Platelets: 282 10*3/uL (ref 150–450)
RBC: 4.6 x10E6/uL (ref 3.77–5.28)
RDW: 12.5 % (ref 11.7–15.4)
WBC: 7.1 10*3/uL (ref 3.4–10.8)

## 2023-02-27 NOTE — Progress Notes (Signed)
Please let patient know that her lab work looks good.  No concerns a this time.  Continue with current medication regimen.  Follow up as discussed.

## 2023-03-06 ENCOUNTER — Ambulatory Visit: Payer: Self-pay | Admitting: *Deleted

## 2023-03-06 NOTE — Telephone Encounter (Signed)
  Chief Complaint: Headache Symptoms: Headache 1/10 in AM, worsens throughout day At HS 3/10. Top of head through back of head. Frequency: Friday Pertinent Negatives: Patient denies dizziness, weakness, nausea Disposition: [] ED /[] Urgent Care (no appt availability in office) / [x] Appointment(In office/virtual)/ []  [facility]/ [] Home Care/ [] Refused Recommended Disposition /[] [place] [facility]/ []  Follow-up with PCP Additional Notes: States had eye injections for her macular degeneration on Wednesday. Advised to alert ophthalmologist. States she read up on symptoms and headache was not one of them. Care advise provided, verbalizes understanding. Advised ED for worsening symptoms. Appt secured for Friday per pt's schedule Reason for Disposition  [1] MILD-MODERATE headache AND [2] present > 72 hours  Answer Assessment - Initial Assessment Questions 1. LOCATION: "Where does it hurt?"      Top of head, moves to back of head. 2. ONSET: "When did the headache start?" (Minutes, hours or days)      1 week ago 3. PATTERN: "Does the pain come and go, or has it been constant since it started?"     Mild in AM, worsens throughout day 4. SEVERITY: "How bad is the pain?" and "What does it keep you from doing?"  (e.g., Scale 1-10; mild, moderate, or severe)   - MILD (1-3): doesn't interfere with normal activities    - MODERATE (4-7): interferes with normal activities or awakens from sleep    - SEVERE (8-10): excruciating pain, unable to do any normal activities        1/10 during day  at HS  3/10 5. RECURRENT SYMPTOM: "Have you ever had headaches before?" If Yes, ask: "When was the last time?" and "What happened that time?"      Yes, sinus issues. But none presently 6. CAUSE: "What do you think is causing the headache?"     Maybe new med for eyes 7. MIGRAINE: "Have you been diagnosed with migraine headaches?" If Yes, ask: "Is this headache similar?"      No 8. HEAD INJURY: "Has there  been any recent injury to the head?"      No 9. OTHER SYMPTOMS: "Do you have any other symptoms?" (fever, stiff neck, eye pain, sore throat, cold symptoms)     no  Protocols used: Headache-A-AH

## 2023-03-07 NOTE — Progress Notes (Signed)
BP 134/70   Pulse 78   Temp 98.1 F (36.7 C) (Oral)   Wt 164 lb 9.6 oz (74.7 kg)   LMP 01/03/1999 (Approximate)   SpO2 98%   BMI 26.57 kg/m    Subjective:    Patient ID: Melanie Wallace, female    DOB: 1946-12-06, 77 y.o.   MRN: [id number]  HPI: Melanie Wallace is a 77 y.o. female  Chief Complaint  Patient presents with   Headache    Pt states she has been having a constant headache for the last week. States she has taking tylenol and it did not help.    HEADACHES Had a recent eye injection in [place].  Then started having a dull headache.  Sometimes she feels like there is a pulsing in her neck.  She doesn't even notice it when she is moving around but it is very dull when she sits down quietly.   Duration:  1 week Onset: sudden Severity: 1/10 Quality: pressure-like Frequency: constant Location: front and top of head Headache duration:1 week Radiation: no Time of day headache occurs: all day Alleviating factors: none Aggravating factors: none Headache status at time of visit: current headache Treatments attempted: Treatments attempted:  tylenol    Aura: no Nausea:  no Vomiting: no Photophobia:  no Phonophobia:  no Effect on social functioning:  no Numbers of missed days of school/work each month:  Confusion:  no Gait disturbance/ataxia:  no Behavioral changes:  no Fevers:  no  Relevant past medical, surgical, family and social history reviewed and updated as indicated. Interim medical history since our last visit reviewed. Allergies and medications reviewed and updated.  Review of Systems  Constitutional:  Negative for fever.  Eyes:  Negative for visual disturbance.  Gastrointestinal:  Negative for nausea and vomiting.  Neurological:  Positive for headaches.    Per HPI unless specifically indicated above     Objective:    BP 134/70   Pulse 78   Temp 98.1 F (36.7 C) (Oral)   Wt 164 lb 9.6 oz (74.7 kg)   LMP 01/03/1999 (Approximate)   SpO2  98%   BMI 26.57 kg/m   Wt Readings from Last 3 Encounters:  03/08/23 164 lb 9.6 oz (74.7 kg)  02/26/23 165 lb 14.4 oz (75.3 kg)  08/28/22 162 lb 8 oz (73.7 kg)    Physical Exam Vitals and nursing note reviewed.  Constitutional:      General: She is not in acute distress.    Appearance: Normal appearance. She is normal weight. She is not ill-appearing, toxic-appearing or diaphoretic.  HENT:     Head: Normocephalic.     Right Ear: External ear normal.     Left Ear: External ear normal.     Nose: Nose normal.     Mouth/Throat:     Mouth: Mucous membranes are moist.     Pharynx: Oropharynx is clear.  Eyes:     General:        Right eye: No discharge.        Left eye: No discharge.     Extraocular Movements: Extraocular movements intact.     Conjunctiva/sclera: Conjunctivae normal.     Pupils: Pupils are equal, round, and reactive to light.  Neck:     Comments: Muscle tension in bilateral neck  Cardiovascular:     Rate and Rhythm: Normal rate and regular rhythm.     Heart sounds: No murmur heard. Pulmonary:     Effort: Pulmonary effort  is normal. No respiratory distress.     Breath sounds: Normal breath sounds. No wheezing or rales.  Musculoskeletal:     Cervical back: Normal range of motion and neck supple. Muscular tenderness present.  Skin:    General: Skin is warm and dry.     Capillary Refill: Capillary refill takes less than 2 seconds.  Neurological:     General: No focal deficit present.     Mental Status: She is alert and oriented to person, place, and time. Mental status is at baseline.  Psychiatric:        Mood and Affect: Mood normal.        Behavior: Behavior normal.        Thought Content: Thought content normal.        Judgment: Judgment normal.     Results for orders placed or performed in visit on 02/26/23  Comp Met (CMET)  Result Value Ref Range   Glucose 93 70 - 99 mg/dL   BUN 14 8 - 27 mg/dL   Creatinine, Ser 0.68 0.57 - 1.00 mg/dL   eGFR 91 >59  mL/min/1.73   BUN/Creatinine Ratio 21 12 - 28   Sodium 142 134 - 144 mmol/L   Potassium 4.2 3.5 - 5.2 mmol/L   Chloride 103 96 - 106 mmol/L   CO2 24 20 - 29 mmol/L   Calcium 9.6 8.7 - 10.3 mg/dL   Total Protein 6.8 6.0 - 8.5 g/dL   Albumin 4.6 3.8 - 4.8 g/dL   Globulin, Total 2.2 1.5 - 4.5 g/dL   Albumin/Globulin Ratio 2.1 1.2 - 2.2   Bilirubin Total 0.6 0.0 - 1.2 mg/dL   Alkaline Phosphatase 62 44 - 121 IU/L   AST 15 0 - 40 IU/L   ALT 12 0 - 32 IU/L  Lipid Profile  Result Value Ref Range   Cholesterol, Total 140 100 - 199 mg/dL   Triglycerides 81 0 - 149 mg/dL   HDL 60 >39 mg/dL   VLDL Cholesterol Cal 16 5 - 40 mg/dL   LDL Chol Calc (NIH) 64 0 - 99 mg/dL   Chol/HDL Ratio 2.3 0.0 - 4.4 ratio  CBC w/Diff  Result Value Ref Range   WBC 7.1 3.4 - 10.8 x10E3/uL   RBC 4.60 3.77 - 5.28 x10E6/uL   Hemoglobin 14.1 11.1 - 15.9 g/dL   Hematocrit 41.5 34.0 - 46.6 %   MCV 90 79 - 97 fL   MCH 30.7 26.6 - 33.0 pg   MCHC 34.0 31.5 - 35.7 g/dL   RDW 12.5 11.7 - 15.4 %   Platelets 282 150 - 450 x10E3/uL   Neutrophils 68 Not Estab. %   Lymphs 22 Not Estab. %   Monocytes 8 Not Estab. %   Eos 2 Not Estab. %   Basos 0 Not Estab. %   Neutrophils Absolute 4.8 1.4 - 7.0 x10E3/uL   Lymphocytes Absolute 1.6 0.7 - 3.1 x10E3/uL   Monocytes Absolute 0.6 0.1 - 0.9 x10E3/uL   EOS (ABSOLUTE) 0.2 0.0 - 0.4 x10E3/uL   Basophils Absolute 0.0 0.0 - 0.2 x10E3/uL   Immature Granulocytes 0 Not Estab. %   Immature Grans (Abs) 0.0 0.0 - 0.1 x10E3/uL      Assessment & Plan:   Problem List Items Addressed This Visit   None Visit Diagnoses     Acute nonintractable headache, unspecified headache type    -  Primary   Will treat with Baclofen.  Take at bedtime due to sid effects  Follow up if symptoms not improved.   Relevant Medications   baclofen (LIORESAL) 10 MG tablet        Follow up plan: Return if symptoms worsen or fail to improve.

## 2023-03-08 ENCOUNTER — Ambulatory Visit (INDEPENDENT_AMBULATORY_CARE_PROVIDER_SITE_OTHER): Payer: Medicare Other | Admitting: Nurse Practitioner

## 2023-03-08 ENCOUNTER — Encounter: Payer: Self-pay | Admitting: Nurse Practitioner

## 2023-03-08 VITALS — BP 134/70 | HR 78 | Temp 98.1°F | Wt 164.6 lb

## 2023-03-08 DIAGNOSIS — R519 Headache, unspecified: Secondary | ICD-10-CM | POA: Diagnosis not present

## 2023-03-08 MED ORDER — BACLOFEN 10 MG PO TABS: 10.0000 mg | ORAL_TABLET | Freq: Every evening | ORAL | 0 refills | Status: DC

## 2023-03-13 ENCOUNTER — Ambulatory Visit: Payer: Self-pay | Admitting: *Deleted

## 2023-03-13 NOTE — Telephone Encounter (Signed)
Patient notified and verbalized understanding of providers message.

## 2023-03-13 NOTE — Telephone Encounter (Signed)
Summary: Medicaiton Advice   Pt is calling to report that she was seen in office on 03/08/23 for headaches Pt is taking baclofen (LIORESAL) 10 MG tablet [id number] - low back has started hurting. Pt would like to know if she stops taking the medication will the headaches or a reaction happen?          Called patient to review medication question. Patient would like to know if stopping baclofen and restarting ,if pain returns, would cause side effects. Reviewed with patient baclofen prescribed to be taken at bedtime due to possible side effects of drowsiness or feeling sleepy for headaches. Take as prescribed for headaches. If baclofen stopped to see if headaches or low back pain goes away it will be ok. And if headache returns can take baclofen again at bedtime if sx return. Please advise if PCP requesting patient should not stop medication and restart as needed. Patient reports headache and low back pain better. Please advise if baclofen should be taken every hs and not stopped. If so, please call patient back.    Reason for Disposition  [1] Caller requesting NON-URGENT health information AND [2] PCP's office is the best resource  Answer Assessment - Initial Assessment Questions 1. REASON FOR CALL or QUESTION: "What is your reason for calling today?" or "How can I best help you?" or "What question do you have that I can help answer?"     Patient would like to know if stopping the baclofen  and restarting as needed would cause side effects?  Protocols used: Information Only Call - No Triage-A-AH

## 2023-03-13 NOTE — Telephone Encounter (Signed)
Okay to stop medication and restart if symptoms return.

## 2023-03-30 ENCOUNTER — Other Ambulatory Visit: Payer: Self-pay | Admitting: Nurse Practitioner

## 2023-04-01 NOTE — Telephone Encounter (Signed)
Requested Prescriptions  Pending Prescriptions Disp Refills   baclofen (LIORESAL) 10 MG tablet [Pharmacy Med Name: BACLOFEN 10 MG TABLET] 90 tablet 1    Sig: TAKE 1 TABLET BY MOUTH EVERYDAY AT BEDTIME     Analgesics:  Muscle Relaxants - baclofen Passed - 03/30/2023  9:31 AM      Passed - Cr in normal range and within 180 days    Creatinine, Ser  Date Value Ref Range Status  02/26/2023 0.68 0.57 - 1.00 mg/dL Final         Passed - eGFR is 30 or above and within 180 days    GFR calc Af Amer  Date Value Ref Range Status  12/05/2020 102 >59 mL/min/1.73 Final    Comment:    **In accordance with recommendations from the NKF-ASN Task force,**   [facility] is in the process of updating its eGFR calculation to the   2021 CKD-EPI creatinine equation that estimates kidney function   without a race variable.    GFR, Estimated  Date Value Ref Range Status  05/18/2021 >60 >60 mL/min Final    Comment:    (NOTE) Calculated using the CKD-EPI Creatinine Equation (2021)    eGFR  Date Value Ref Range Status  02/26/2023 91 >59 mL/min/1.73 Final         Passed - Valid encounter within last 6 months    Recent Outpatient Visits           3 weeks ago Acute nonintractable headache, unspecified headache type   [place] [facility] Larae Grooms, NP   1 month ago Encounter for annual wellness exam in Medicare patient   [place] [facility] Larae Grooms, NP   7 months ago Annual physical exam   [place] [facility] Larae Grooms, NP   11 months ago Major depressive disorder with single episode, in full remission [facility])   [place] [facility] Larae Grooms, NP   1 year ago Major depressive disorder with single episode, in full remission (HCC)   [place] [facility] Vigg, Avanti, MD       Future Appointments             In 5 months Mecum, Oswaldo Conroy, PA-C  [facility]

## 2023-05-02 DIAGNOSIS — Z880 Allergy status to penicillin: Secondary | ICD-10-CM | POA: Diagnosis not present

## 2023-05-02 DIAGNOSIS — Z803 Family history of malignant neoplasm of breast: Secondary | ICD-10-CM | POA: Diagnosis not present

## 2023-05-02 DIAGNOSIS — R011 Cardiac murmur, unspecified: Secondary | ICD-10-CM | POA: Diagnosis not present

## 2023-05-02 DIAGNOSIS — Z882 Allergy status to sulfonamides status: Secondary | ICD-10-CM | POA: Diagnosis not present

## 2023-05-02 DIAGNOSIS — I071 Rheumatic tricuspid insufficiency: Secondary | ICD-10-CM | POA: Diagnosis not present

## 2023-05-02 DIAGNOSIS — Z87891 Personal history of nicotine dependence: Secondary | ICD-10-CM | POA: Diagnosis not present

## 2023-05-02 DIAGNOSIS — E785 Hyperlipidemia, unspecified: Secondary | ICD-10-CM | POA: Diagnosis not present

## 2023-05-02 DIAGNOSIS — I480 Paroxysmal atrial fibrillation: Secondary | ICD-10-CM | POA: Diagnosis not present

## 2023-05-02 DIAGNOSIS — Z7901 Long term (current) use of anticoagulants: Secondary | ICD-10-CM | POA: Diagnosis not present

## 2023-05-02 DIAGNOSIS — Z800 Family history of malignant neoplasm of digestive organs: Secondary | ICD-10-CM | POA: Diagnosis not present

## 2023-05-02 DIAGNOSIS — Z79899 Other long term (current) drug therapy: Secondary | ICD-10-CM | POA: Diagnosis not present

## 2023-05-02 DIAGNOSIS — I340 Nonrheumatic mitral (valve) insufficiency: Secondary | ICD-10-CM | POA: Diagnosis not present

## 2023-05-02 DIAGNOSIS — I10 Essential (primary) hypertension: Secondary | ICD-10-CM | POA: Diagnosis not present

## 2023-05-02 DIAGNOSIS — I4891 Unspecified atrial fibrillation: Secondary | ICD-10-CM | POA: Diagnosis not present

## 2023-05-02 DIAGNOSIS — I081 Rheumatic disorders of both mitral and tricuspid valves: Secondary | ICD-10-CM | POA: Diagnosis not present

## 2023-05-02 DIAGNOSIS — Z808 Family history of malignant neoplasm of other organs or systems: Secondary | ICD-10-CM | POA: Diagnosis not present

## 2023-05-08 DIAGNOSIS — H353222 Exudative age-related macular degeneration, left eye, with inactive choroidal neovascularization: Secondary | ICD-10-CM | POA: Diagnosis not present

## 2023-05-08 DIAGNOSIS — H353220 Exudative age-related macular degeneration, left eye, stage unspecified: Secondary | ICD-10-CM | POA: Diagnosis not present

## 2023-06-05 DIAGNOSIS — M25572 Pain in left ankle and joints of left foot: Secondary | ICD-10-CM | POA: Diagnosis not present

## 2023-06-19 DIAGNOSIS — M25572 Pain in left ankle and joints of left foot: Secondary | ICD-10-CM | POA: Diagnosis not present

## 2023-06-29 ENCOUNTER — Emergency Department
Admission: EM | Admit: 2023-06-29 | Discharge: 2023-06-29 | Disposition: A | Discharge status: Home / Self Care | Payer: Medicare HMO | Attending: Emergency Medicine | Admitting: Emergency Medicine

## 2023-06-29 ENCOUNTER — Other Ambulatory Visit: Payer: Self-pay

## 2023-06-29 DIAGNOSIS — I10 Essential (primary) hypertension: Secondary | ICD-10-CM | POA: Insufficient documentation

## 2023-06-29 DIAGNOSIS — Z8544 Personal history of malignant neoplasm of other female genital organs: Secondary | ICD-10-CM | POA: Diagnosis not present

## 2023-06-29 DIAGNOSIS — Z853 Personal history of malignant neoplasm of breast: Secondary | ICD-10-CM | POA: Insufficient documentation

## 2023-06-29 DIAGNOSIS — M79605 Pain in left leg: Secondary | ICD-10-CM | POA: Diagnosis not present

## 2023-06-29 DIAGNOSIS — M79675 Pain in left toe(s): Secondary | ICD-10-CM

## 2023-06-29 NOTE — Discharge Instructions (Addendum)
Please follow-up with the podiatrist.  You did not wish for your toenail to be removed today.  Keep the toe clean, wash with soap and water.  Please return for any new, worsening, or change in symptoms or other concerns.  It was a pleasure caring for you today.

## 2023-06-29 NOTE — ED Triage Notes (Signed)
Pt presents via POV c/o left big toe pain. Reports hit nail last night and lifted nail from nail bed. Reports some bleeding. Reports on anticoagulation.

## 2023-06-29 NOTE — ED Provider Notes (Signed)
[facility] Provider Note    Event Date/Time   First MD Initiated Contact with Patient 06/29/23 1142     (approximate)   History   Toe Pain   HPI  Melanie Wallace is a 77 y.o. female with a past medical history of major depressive disorder, hyperlipidemia, hypertension who presents today for evaluation of left great toe injury.  Patient reports that she hit her toe against an object last night and lifted the nail up.  She reports that she tacked down.  She reports that she has an appointment with her podiatrist in a week, but she was unsure what to do about her nail today.  She denies any pain.  She is able to walk with a normal gait.  The bleeding has stopped.  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/14/2015   History of cancer of vagina 07/14/2015   Allergic rhinitis 07/14/2015   Hypertension 07/14/2015   Depression 07/14/2015   History of breast cancer 04/13/2013          Physical Exam   Triage Vital Signs: ED Triage Vitals [06/29/23 1049]  Encounter Vitals Group     BP (!) 134/105     Systolic BP Percentile      Diastolic BP Percentile      Pulse Rate 95     Resp 16     Temp 98.4 F (36.9 C)     Temp Source Oral     SpO2 99 %     Weight      Height      Head Circumference      Peak Flow      Pain Score 2     Pain Loc      Pain Education      Exclude from Growth Chart     Most recent vital signs: Vitals:   06/29/23 1049  BP: (!) 134/105  Pulse: 95  Resp: 16  Temp: 98.4 F (36.9 C)   SpO2: 99%    Physical Exam Vitals and nursing note reviewed.  Constitutional:      General: Awake and alert. No acute distress.    Appearance: Normal appearance. The patient is normal weight.  HENT:     Head: Normocephalic and atraumatic.     Mouth: Mucous membranes are moist.  Eyes:     General: PERRL. Normal EOMs        Right eye: No discharge.        Left eye: No discharge.     Conjunctiva/sclera: Conjunctivae normal.  Cardiovascular:     Rate and Rhythm: Normal rate and regular rhythm.     Pulses: Normal pulses.  Pulmonary:     Effort: Pulmonary effort is normal. No respiratory distress.     Breath sounds: Normal breath sounds.  Abdominal:     Abdomen is soft. There is no abdominal tenderness. No rebound or guarding. No distention. Musculoskeletal:        General: No swelling. Normal range of motion.     Cervical back: Normal range of motion and neck supple.  Left  great toe with thickened and abnormally shaped toenail with mild loosening at the nailbed, the nail is still firmly embedded within the nailbed.  There is no active bleeding.  She has no bony tenderness.  There is no subungual hematoma or abscess.  She has 2+ pedal pulses.  Sensation intact light touch throughout. Skin:    General: Skin is warm and dry.     Capillary Refill: Capillary refill takes less than 2 seconds.     Findings: No rash.  Neurological:     Mental Status: The patient is awake and alert.      ED Results / Procedures / Treatments   Labs (all labs ordered are listed, but only abnormal results are displayed) Labs Reviewed - No data to display   EKG     RADIOLOGY     PROCEDURES:  Critical Care performed:   Procedures   MEDICATIONS ORDERED IN ED: Medications - No data to display   IMPRESSION / MDM / ASSESSMENT AND PLAN / ED COURSE  I reviewed the triage vital signs and the nursing notes.   Differential diagnosis includes, but is not limited to, nailbed injury, fracture,  dislocation.  Patient is awake and alert, hemodynamically stable and neurovascularly intact.  Her nail is lifted, however there does not appear to be a nailbed laceration and the toenail still firmly embedded within the eponychial fold.  I offered an x-ray the patient declined.  She reports that she has no bony tenderness.  I gave the patient the option of toenail removal versus tacking it down.  She does not want her toenail removed today.  She reports that she has a follow-up appointment with her podiatrist this week, and she reports that she can have her toenail removed then if she decides to at a later time, but today she just wants it to be tacked down.  Therefore the area was cleaned with Betadine and chlorhexidine, and then the nail was confirmed to be within the eponychial fold, then tacked down with Steri-Strips.  We discussed wound care and the importance of close follow-up appointment with podiatry.  She also understands return precautions.  Patient agrees with plan of care.  She was discharged in stable condition.   Patient's presentation is most consistent with acute illness / injury with system symptoms.   FINAL CLINICAL IMPRESSION(S) / ED DIAGNOSES   Final diagnoses:  Pain of left great toe     Rx / DC Orders   ED Discharge Orders     None        Note:  This document was prepared using Dragon voice recognition software and may include unintentional dictation errors.   Keturah Shavers 06/29/23 1302    Dionne Bucy, MD 06/29/23 [phone]

## 2023-07-03 DIAGNOSIS — B351 Tinea unguium: Secondary | ICD-10-CM | POA: Diagnosis not present

## 2023-07-03 DIAGNOSIS — S9032XA Contusion of left foot, initial encounter: Secondary | ICD-10-CM | POA: Diagnosis not present

## 2023-07-03 DIAGNOSIS — S90122A Contusion of left lesser toe(s) without damage to nail, initial encounter: Secondary | ICD-10-CM | POA: Diagnosis not present

## 2023-07-03 DIAGNOSIS — L03032 Cellulitis of left toe: Secondary | ICD-10-CM | POA: Diagnosis not present

## 2023-07-04 ENCOUNTER — Telehealth: Payer: Self-pay

## 2023-07-04 DIAGNOSIS — M25572 Pain in left ankle and joints of left foot: Secondary | ICD-10-CM | POA: Diagnosis not present

## 2023-07-04 NOTE — Telephone Encounter (Signed)
Transition Care Management Unsuccessful Follow-up Telephone Call  Date of discharge and from where:  [place] 7/13  Attempts:  1st Attempt  Reason for unsuccessful TCM follow-up call:  No answer/busy   Lenard Forth [facility] Guide, [facility] [phone] [address] [place], [place], [place] [address] Phone: [phone] Email: [email or web address]

## 2023-07-05 ENCOUNTER — Telehealth: Payer: Self-pay

## 2023-07-05 NOTE — Telephone Encounter (Signed)
Transition Care Management Unsuccessful Follow-up Telephone Call  Date of discharge and from where:  [place] 7/13  Attempts:  2nd Attempt  Reason for unsuccessful TCM follow-up call:  No answer/busy   Lenard Forth [facility] Guide, [facility] [phone] [address] [place], [place], [place] [address] Phone: [phone] Email: [email or web address]

## 2023-07-09 DIAGNOSIS — L03032 Cellulitis of left toe: Secondary | ICD-10-CM | POA: Diagnosis not present

## 2023-07-16 ENCOUNTER — Ambulatory Visit: Payer: Medicare HMO | Admitting: Podiatry

## 2023-07-17 DIAGNOSIS — H353221 Exudative age-related macular degeneration, left eye, with active choroidal neovascularization: Secondary | ICD-10-CM | POA: Diagnosis not present

## 2023-07-17 DIAGNOSIS — H353110 Nonexudative age-related macular degeneration, right eye, stage unspecified: Secondary | ICD-10-CM | POA: Diagnosis not present

## 2023-07-17 DIAGNOSIS — H2513 Age-related nuclear cataract, bilateral: Secondary | ICD-10-CM | POA: Diagnosis not present

## 2023-07-17 DIAGNOSIS — H35371 Puckering of macula, right eye: Secondary | ICD-10-CM | POA: Diagnosis not present

## 2023-07-26 ENCOUNTER — Encounter: Payer: Self-pay | Admitting: Family Medicine

## 2023-07-26 ENCOUNTER — Ambulatory Visit (INDEPENDENT_AMBULATORY_CARE_PROVIDER_SITE_OTHER): Payer: Medicare HMO | Admitting: Family Medicine

## 2023-07-26 VITALS — BP 136/80 | HR 77 | Temp 98.5°F | Wt 162.4 lb

## 2023-07-26 DIAGNOSIS — M7989 Other specified soft tissue disorders: Secondary | ICD-10-CM | POA: Diagnosis not present

## 2023-07-26 NOTE — Patient Instructions (Addendum)
Soak feet in epsom salt twice daily for 20 minutes for two weeks and take Tylenol twice daily for 5-7 days.   Use Mupirocin ointment after soaking  Schedule appointment with podiatry

## 2023-07-26 NOTE — Assessment & Plan Note (Addendum)
Acute, stable. Recommend continue soaking big toe in Epsom salt BID for 2 weeks, continue applying Mupirocin ointment daily, and Tylenol BID for one week since allergy to Sulfasalazine. Recommend follow up with podiatry since post toe nail removal, and return to clinic as needed.

## 2023-07-26 NOTE — Progress Notes (Signed)
BP 136/80   Pulse 77   Temp 98.5 F (36.9 C) (Oral)   Wt 162 lb 6.4 oz (73.7 kg)   LMP 01/03/1999 (Approximate)   SpO2 98%   BMI 26.21 kg/m    Subjective:    Patient ID: Melanie Wallace, female    DOB: 1946-05-08, 77 y.o.   MRN: [id number]  HPI: Melanie Wallace is a 77 y.o. female  Chief Complaint  Patient presents with   Nail Problem    Pt states she was seen at the ER 06/29/23 for her nail on her L big toe. States she hit it and the nail raised. Saw podiatry 3 days later, and they removed the nail. Patient states she started to have an infection so she went to [facility] and was prescribed antibiotics. States she was referred here from [facility] to have the toe checked.    TOE PAIN After seeing podiatry for removal of her left big toe nail, she has completed the course of x2 antibiotics, Doxycycline and Clindamycin, over the past 2 weeks for redness and swelling. She has hive allergy to sulfasalazine (NSAID).  Duration: 2 weeks Involved toe: leftbig toe nail  Mechanism of injury:  stomped toe Treatments attempted:  Soaking toe in epsom salt  for 15 minutes everyday. Mupirocin ointment for 1.5 week Relief with NSAIDs?: No NSAIDs Taken Morning stiffness: no Redness: yes  Bruising: no Swelling: yes Paresthesias / decreased sensation: no Fevers: no  Relevant past medical, surgical, family and social history reviewed and updated as indicated. Interim medical history since our last visit reviewed. Allergies and medications reviewed and updated.  Review of Systems  Constitutional:  Negative for fever.  Respiratory: Negative.    Cardiovascular: Negative.   Skin:  Negative for color change.       Edema of left big toe  Neurological:  Negative for numbness.    Per HPI unless specifically indicated above     Objective:    BP 136/80   Pulse 77   Temp 98.5 F (36.9 C) (Oral)   Wt 162 lb 6.4 oz (73.7 kg)   LMP 01/03/1999 (Approximate)   SpO2 98%   BMI 26.21 kg/m   Wt Readings  from Last 3 Encounters:  07/26/23 162 lb 6.4 oz (73.7 kg)  03/08/23 164 lb 9.6 oz (74.7 kg)  02/26/23 165 lb 14.4 oz (75.3 kg)    Physical Exam Vitals and nursing note reviewed.  Constitutional:      General: She is awake. She is not in acute distress.    Appearance: Normal appearance. She is well-developed and well-groomed. She is not ill-appearing.  HENT:     Head: Normocephalic and atraumatic.     Right Ear: Hearing and external ear normal. No drainage.     Left Ear: Hearing and external ear normal. No drainage.     Nose: Nose normal.  Eyes:     General: Lids are normal.        Right eye: No discharge.        Left eye: No discharge.     Conjunctiva/sclera: Conjunctivae normal.  Cardiovascular:     Rate and Rhythm: Normal rate and regular rhythm.     Pulses:          Radial pulses are 2+ on the right side and 2+ on the left side.       Dorsalis pedis pulses are 2+ on the right side and 2+ on the left side.  Posterior tibial pulses are 2+ on the right side and 2+ on the left side.     Heart sounds: Normal heart sounds, S1 normal and S2 normal. No murmur heard.    No gallop.  Pulmonary:     Effort: Pulmonary effort is normal. No accessory muscle usage or respiratory distress.     Breath sounds: Normal breath sounds.  Musculoskeletal:        General: Normal range of motion.     Cervical back: Full passive range of motion without pain and normal range of motion.     Right lower leg: No edema.     Left lower leg: No edema.  Skin:    General: Skin is warm and dry.     Capillary Refill: Capillary refill takes less than 2 seconds.     Comments: Removed toe nail of left big toe, left big toe edema  Neurological:     Mental Status: She is alert and oriented to person, place, and time.  Psychiatric:        Attention and Perception: Attention normal.        Mood and Affect: Mood normal.        Speech: Speech normal.        Behavior: Behavior normal. Behavior is cooperative.         Thought Content: Thought content normal.     Results for orders placed or performed in visit on 02/26/23  Comp Met (CMET)  Result Value Ref Range   Glucose 93 70 - 99 mg/dL   BUN 14 8 - 27 mg/dL   Creatinine, Ser 8.41 0.57 - 1.00 mg/dL   eGFR 91 >32 GM/WNU/2.72   BUN/Creatinine Ratio 21 12 - 28   Sodium 142 134 - 144 mmol/L   Potassium 4.2 3.5 - 5.2 mmol/L   Chloride 103 96 - 106 mmol/L   CO2 24 20 - 29 mmol/L   Calcium 9.6 8.7 - 10.3 mg/dL   Total Protein 6.8 6.0 - 8.5 g/dL   Albumin 4.6 3.8 - 4.8 g/dL   Globulin, Total 2.2 1.5 - 4.5 g/dL   Albumin/Globulin Ratio 2.1 1.2 - 2.2   Bilirubin Total 0.6 0.0 - 1.2 mg/dL   Alkaline Phosphatase 62 44 - 121 IU/L   AST 15 0 - 40 IU/L   ALT 12 0 - 32 IU/L  Lipid Profile  Result Value Ref Range   Cholesterol, Total 140 100 - 199 mg/dL   Triglycerides 81 0 - 149 mg/dL   HDL 60 >53 mg/dL   VLDL Cholesterol Cal 16 5 - 40 mg/dL   LDL Chol Calc (NIH) 64 0 - 99 mg/dL   Chol/HDL Ratio 2.3 0.0 - 4.4 ratio  CBC w/Diff  Result Value Ref Range   WBC 7.1 3.4 - 10.8 x10E3/uL   RBC 4.60 3.77 - 5.28 x10E6/uL   Hemoglobin 14.1 11.1 - 15.9 g/dL   Hematocrit 66.4 40.3 - 46.6 %   MCV 90 79 - 97 fL   MCH 30.7 26.6 - 33.0 pg   MCHC 34.0 31.5 - 35.7 g/dL   RDW 47.4 25.9 - 56.3 %   Platelets 282 150 - 450 x10E3/uL   Neutrophils 68 Not Estab. %   Lymphs 22 Not Estab. %   Monocytes 8 Not Estab. %   Eos 2 Not Estab. %   Basos 0 Not Estab. %   Neutrophils Absolute 4.8 1.4 - 7.0 x10E3/uL   Lymphocytes Absolute 1.6 0.7 - 3.1 x10E3/uL  Monocytes Absolute 0.6 0.1 - 0.9 x10E3/uL   EOS (ABSOLUTE) 0.2 0.0 - 0.4 x10E3/uL   Basophils Absolute 0.0 0.0 - 0.2 x10E3/uL   Immature Granulocytes 0 Not Estab. %   Immature Grans (Abs) 0.0 0.0 - 0.1 x10E3/uL      Assessment & Plan:   Problem List Items Addressed This Visit     Swelling of toe of left foot - Primary    Acute, stable. Recommend continue soaking big toe in Epsom salt BID for 2 weeks,  continue applying Mupirocin ointment daily, and Tylenol BID for one week since allergy to Sulfasalazine. Recommend follow up with podiatry since post toe nail removal, and return to clinic as needed.         Follow up plan: Return if symptoms worsen or fail to improve.

## 2023-08-08 DIAGNOSIS — L03032 Cellulitis of left toe: Secondary | ICD-10-CM | POA: Diagnosis not present

## 2023-08-14 DIAGNOSIS — M25571 Pain in right ankle and joints of right foot: Secondary | ICD-10-CM | POA: Diagnosis not present

## 2023-08-29 ENCOUNTER — Encounter: Payer: Medicare Other | Admitting: Physician Assistant

## 2023-09-02 ENCOUNTER — Encounter: Payer: Self-pay | Admitting: Physician Assistant

## 2023-09-02 ENCOUNTER — Ambulatory Visit (INDEPENDENT_AMBULATORY_CARE_PROVIDER_SITE_OTHER): Payer: Medicare HMO | Admitting: Physician Assistant

## 2023-09-02 VITALS — BP 145/78 | HR 72 | Ht 66.0 in | Wt 161.8 lb

## 2023-09-02 DIAGNOSIS — I480 Paroxysmal atrial fibrillation: Secondary | ICD-10-CM | POA: Diagnosis not present

## 2023-09-02 DIAGNOSIS — E782 Mixed hyperlipidemia: Secondary | ICD-10-CM

## 2023-09-02 DIAGNOSIS — I10 Essential (primary) hypertension: Secondary | ICD-10-CM

## 2023-09-02 DIAGNOSIS — F324 Major depressive disorder, single episode, in partial remission: Secondary | ICD-10-CM | POA: Diagnosis not present

## 2023-09-02 DIAGNOSIS — Z0000 Encounter for general adult medical examination without abnormal findings: Secondary | ICD-10-CM

## 2023-09-02 MED ORDER — CETIRIZINE HCL 5 MG PO TABS: 5.0000 mg | ORAL_TABLET | Freq: Every day | ORAL | 1 refills | Status: DC

## 2023-09-02 MED ORDER — BACLOFEN 10 MG PO TABS: 10.0000 mg | ORAL_TABLET | Freq: Every day | ORAL | 1 refills | Status: DC

## 2023-09-02 MED ORDER — LOSARTAN POTASSIUM-HCTZ 50-12.5 MG PO TABS: 1.0000 | ORAL_TABLET | Freq: Every day | ORAL | 1 refills | Status: DC

## 2023-09-02 MED ORDER — CITALOPRAM HYDROBROMIDE 40 MG PO TABS: 40.0000 mg | ORAL_TABLET | Freq: Every day | ORAL | 1 refills | Status: DC

## 2023-09-02 NOTE — Assessment & Plan Note (Signed)
Chronic, historic condition, ongoing  She is seen by Cardiology for monitoring - current taking Eliquis 5 mg PO BID and Metoprolol 25 mg PO every day per Cardiology management  Continue collaboration with specialty  Follow up in 6 months or sooner if concerns arise

## 2023-09-02 NOTE — Assessment & Plan Note (Signed)
Chronic, historic condition Appears well managed on current regimen comprised of Celexa 40 mg PO every day - refills provided today Continue current regimen Follow up in 6 months or sooner if concerns arise

## 2023-09-02 NOTE — Assessment & Plan Note (Signed)
Chronic, historic condition Appears controlled with current medication regimen comprised of Losartan- hydrochlorothiazide 50-12.5 mg PO every day and metoprolol 25 mg PO every day Continue current regimen, Refills provided today for Losartan- hydrochlorothiazide as Metoprolol is managed by Cardiology  Continue checking BP at home for monitoring  Follow up in 6 months or sooner if concerns arise

## 2023-09-02 NOTE — Assessment & Plan Note (Signed)
Chronic, historic condition  Appears well controlled on current regimen comprised of Atorvastatin 10 mg PO every day - managed by Cardiology  Repeat lipid panel today- results to dictate further management Follow up in 6 months or sooner if concerns arise

## 2023-09-02 NOTE — Progress Notes (Signed)
Annual Physical Exam   Name: Melanie Wallace   MRN: [id number]    DOB: June 16, 1946   Date:09/02/2023  Today's Provider: Jacquelin Hawking, MHS, PA-C Introduced myself to the patient as a PA-C and provided education on APPs in clinical practice.         Subjective  Chief Complaint  Chief Complaint  Patient presents with   Annual Exam    HPI  Patient presents for annual CPE.  Diet: feels she is getting a balanced diet of proteins, carbs, vegetables, fats  Exercise: Good- she walks her dog everyday   Sleep:She reports sleep is good, she is getting about 7 hours per night and feels well rested in the AM  Mood:Reports her mood is good     HYPERTENSION / HYPERLIPIDEMIA Satisfied with current treatment? yes Duration of hypertension: years BP monitoring frequency: a few times a week BP range: 120s/80s sometimes gets into 140s in the PM  BP medication side effects: no Past BP meds: Losartan-hydrochlorothiazide 50-12.5 mg PO every day, metoprolol 25 mg PO every day  Duration of hyperlipidemia: years Cholesterol medication side effects: no Cholesterol supplements: none Past cholesterol medications: atorvastain (lipitor) Medication compliance: good compliance Aspirin: no Recent stressors: no Recurrent headaches: no Visual changes: no Palpitations: no Dyspnea: no Chest pain: no Lower extremity edema: no Dizzy/lightheaded: no  Depression Reports she is doing well with current regimen     Flowsheet Row Chronic Care Management from 03/12/2022 in [facility]  AUDIT-C Score 0      Depression: Phq 9 is  negative    09/02/2023   10:06 AM 07/26/2023    9:12 AM 02/26/2023    9:51 AM 08/28/2022    9:19 AM 04/10/2022    8:57 AM  Depression screen PHQ 2/9  Decreased Interest 0 0 0 0 0  Down, Depressed, Hopeless 0 0 0 0 1  PHQ - 2 Score 0 0 0 0 1  Altered sleeping 0 0 3 0 1  Tired, decreased energy 0 0 0 0 0  Change in appetite 0 0 0 0 0  Feeling  bad or failure about yourself  0 0 0 0 0  Trouble concentrating 0 0 0 0 0  Moving slowly or fidgety/restless 0 0 0 0 0  Suicidal thoughts 0 0 0 0 0  PHQ-9 Score 0 0 3 0 2  Difficult doing work/chores Not difficult at all Not difficult at all Not difficult at all Not difficult at all Not difficult at all   Hypertension: BP Readings from Last 3 Encounters:  09/02/23 (!) 145/78  07/26/23 136/80  06/29/23 (!) 134/105   Obesity: Wt Readings from Last 3 Encounters:  09/02/23 161 lb 12.8 oz (73.4 kg)  07/26/23 162 lb 6.4 oz (73.7 kg)  03/08/23 164 lb 9.6 oz (74.7 kg)   BMI Readings from Last 3 Encounters:  09/02/23 26.12 kg/m  07/26/23 26.21 kg/m  03/08/23 26.57 kg/m     Vaccines:   HPV: aged out Tdap: UTD Shingrix: overdue  Pneumonia: completed  Flu: discussed today- offered high dose  COVID-19: Discussed vaccine and booster recommendations per available CDC guidelines     Hep C Screening: completed  HIV screening: completed  STD testing and prevention (HIV/chl/gon/syphilis):  Intimate partner violence: negative Sexual History: She is not sexually active  Menstrual History/LMP/Abnormal Bleeding: denies abnormal spotting or bleeding Discussed importance of follow up if any post-menopausal bleeding: yes Incontinence Symptoms: No.  Breast  cancer hx:  - Last Mammogram: 2022- unable to view results  - BRCA gene screening:   Osteoporosis Prevention : Discussed high calcium and vitamin D supplementation, weight bearing exercises Bone density :Last DEXA was 2018 and demonstrates osteopenia   Cervical cancer screening: no longer indicated   Skin cancer: Discussed monitoring for atypical lesions  Colorectal cancer screening: Completed in 2020- recommended to repeat in 10 years  Lung cancer:  Low Dose CT Chest recommended if Age 30-80 years, 20 pack-year currently smoking OR have quit w/in 15years. Patient does not qualify.   ECG: NA  Advanced Care Planning: A voluntary  discussion about advance care planning including the explanation and discussion of advance directives.  Discussed health care proxy and Living will, and the patient was able to identify a health care proxy as no one.  Patient does not have a living will in effect.  Lipids: Lab Results  Component Value Date   CHOL 140 02/26/2023   CHOL 124 08/28/2022   CHOL 132 02/19/2022   Lab Results  Component Value Date   HDL 60 02/26/2023   HDL 56 08/28/2022   HDL 50 02/19/2022   Lab Results  Component Value Date   LDLCALC 64 02/26/2023   LDLCALC 48 08/28/2022   LDLCALC 51 02/19/2022   Lab Results  Component Value Date   TRIG 81 02/26/2023   TRIG 111 08/28/2022   TRIG 188 (H) 02/19/2022   Lab Results  Component Value Date   CHOLHDL 2.3 02/26/2023   CHOLHDL 2.2 08/28/2022   CHOLHDL 2.6 02/19/2022   No results found for: "LDLDIRECT"  Glucose: Glucose  Date Value Ref Range Status  02/26/2023 93 70 - 99 mg/dL Final  82/95/6213 88 70 - 99 mg/dL Final  08/65/7846 962 (H) 70 - 99 mg/dL Final   Glucose, Bld  Date Value Ref Range Status  05/18/2021 118 (H) 70 - 99 mg/dL Final    Comment:    Glucose reference range applies only to samples taken after fasting for at least 8 hours.    Patient Active Problem List   Diagnosis Date Noted   Swelling of toe of left foot 07/26/2023   Advanced care planning/counseling discussion 02/26/2023   Major depressive disorder with single episode, in full remission (HCC) 02/26/2023   Grief 03/06/2022   Macular degeneration, wet (HCC) 03/01/2021   Internal hemorrhoids 12/05/2020   Other thrombophilia (HCC) 12/02/2020   Hyperlipidemia, mixed 08/29/2020   Gastroesophageal reflux disease 05/04/2020   Atrial fibrillation (HCC) 02/04/2017   Osteopenia of lumbar spine 07/14/2015   History of cancer of vagina 07/14/2015   Allergic rhinitis 07/14/2015   Hypertension 07/14/2015   Depression 07/14/2015   History of breast cancer 04/13/2013    Past  Surgical History:  Procedure Laterality Date   BREAST SURGERY  03/2015   NASAL SEPTUM SURGERY     TUBAL LIGATION      Family History  Problem Relation Age of Onset   Cancer Mother        breast and bone   Hypertension Mother    Cancer Father        prostate   Hypertension Sister    Cancer Brother    Hypertension Daughter    Hypothyroidism Daughter    Allergies Daughter    Hyperlipidemia Maternal Grandmother    Heart attack Maternal Grandmother    Stroke Paternal Grandfather     Social History   Socioeconomic History   Marital status: Married    Spouse name:  Not on file   Number of children: Not on file   Years of education: 12   Highest education level: High school graduate  Occupational History   Not on file  Tobacco Use   Smoking status: Former   Smokeless tobacco: Never   Tobacco comments:    quit in 95- smoked for 3 years total   Vaping Use   Vaping status: Never Used  Substance and Sexual Activity   Alcohol use: No    Alcohol/week: 0.0 standard drinks of alcohol   Drug use: No   Sexual activity: Yes  Other Topics Concern   Not on file  Social History Narrative   Not on file   Social Determinants of Health   Financial Resource Strain: Low Risk  (02/05/2018)   Overall Financial Resource Strain (CARDIA)    Difficulty of Paying Living Expenses: Not hard at all  Food Insecurity: No Food Insecurity (03/12/2022)   Hunger Vital Sign    Worried About Running Out of Food in the Last Year: Never true    Ran Out of Food in the Last Year: Never true  Transportation Needs: No Transportation Needs (02/05/2018)   PRAPARE - Administrator, Civil Service (Medical): No    Lack of Transportation (Non-Medical): No  Physical Activity: Inactive (02/05/2018)   Exercise Vital Sign    Days of Exercise per Week: 0 days    Minutes of Exercise per Session: 0 min  Stress: No Stress Concern Present (02/05/2018)   [employer] of Occupational Health - Occupational  Stress Questionnaire    Feeling of Stress : Not at all  Social Connections: Moderately Integrated (02/05/2018)   Social Connection and Isolation Panel [NHANES]    Frequency of Communication with Friends and Family: More than three times a week    Frequency of Social Gatherings with Friends and Family: More than three times a week    Attends Religious Services: More than 4 times per year    Active Member of [employer] or Organizations: No    Attends Banker Meetings: Never    Marital Status: Married  Catering manager Violence: Not At Risk (02/05/2018)   Humiliation, Afraid, Rape, and Kick questionnaire    Fear of Current or Ex-Partner: No    Emotionally Abused: No    Physically Abused: No    Sexually Abused: No     Current Outpatient Medications:    apixaban (ELIQUIS) 5 MG TABS tablet, Take 5 mg by mouth 2 (two) times daily., Disp: , Rfl:    atorvastatin (LIPITOR) 10 MG tablet, Take 10 mg by mouth daily., Disp: , Rfl:    calcium-vitamin D (OSCAL WITH D) 500-200 MG-UNIT per tablet, Take 1 tablet by mouth., Disp: , Rfl:    metoprolol succinate (TOPROL-XL) 25 MG 24 hr tablet, Take 25 mg by mouth daily., Disp: , Rfl:    Multiple Vitamins-Minerals (PRESERVISION AREDS 2 PO), Take 1 tablet by mouth daily., Disp: , Rfl:    baclofen (LIORESAL) 10 MG tablet, Take 1 tablet (10 mg total) by mouth at bedtime., Disp: 90 tablet, Rfl: 1   cetirizine (ZYRTEC) 5 MG tablet, Take 1 tablet (5 mg total) by mouth daily., Disp: 90 tablet, Rfl: 1   citalopram (CELEXA) 40 MG tablet, Take 1 tablet (40 mg total) by mouth daily., Disp: 90 tablet, Rfl: 1   cyanocobalamin (VITAMIN B12) 1000 MCG tablet, Take 1,000 mcg by mouth daily. (Patient not taking: Reported on 09/02/2023), Disp: , Rfl:  losartan-hydrochlorothiazide (HYZAAR) 50-12.5 MG tablet, Take 1 tablet by mouth daily., Disp: 90 tablet, Rfl: 1   mupirocin ointment (BACTROBAN) 2 %, Apply 1 Application topically daily. (Patient not taking: Reported on  09/02/2023), Disp: , Rfl:   Allergies  Allergen Reactions   Sulfa Antibiotics Hives   Sulfasalazine Hives   Green Tea Leaf Ext Palpitations   Pen-Vee K [Penicillin V] Rash   Penicillin G Benzathine Rash     Review of Systems  Constitutional:  Negative for chills, fever, malaise/fatigue and weight loss.  HENT:  Negative for hearing loss, nosebleeds, sore throat and tinnitus.   Eyes:  Positive for photophobia. Negative for blurred vision and double vision.  Respiratory:  Negative for shortness of breath and wheezing.   Cardiovascular:  Negative for chest pain, palpitations and leg swelling.  Gastrointestinal:  Negative for blood in stool, constipation, diarrhea, heartburn, nausea and vomiting.  Genitourinary:  Negative for dysuria.  Musculoskeletal:  Negative for falls and joint pain.  Skin:  Negative for itching and rash.  Neurological:  Negative for dizziness, tingling, tremors, loss of consciousness, weakness and headaches.  Psychiatric/Behavioral:  Negative for depression, memory loss, substance abuse and suicidal ideas. The patient is not nervous/anxious and does not have insomnia.       Objective  Vitals:   09/02/23 0955 09/02/23 1049  BP: (!) 154/78 (!) 145/78  Pulse: 81 72  SpO2: 99%   Weight: 161 lb 12.8 oz (73.4 kg)   Height: 5\' 6"  (1.676 m)     Body mass index is 26.12 kg/m.  Physical Exam Vitals reviewed.  Constitutional:      General: She is awake.     Appearance: Normal appearance. She is well-developed and well-groomed.  HENT:     Head: Normocephalic and atraumatic.     Right Ear: Hearing and ear canal normal. There is impacted cerumen.     Left Ear: Hearing and ear canal normal. There is impacted cerumen.     Mouth/Throat:     Lips: Pink.     Mouth: Mucous membranes are moist.     Tongue: No lesions.     Pharynx: Uvula midline. No pharyngeal swelling, oropharyngeal exudate, posterior oropharyngeal erythema or postnasal drip.  Eyes:     General:  Lids are normal. Gaze aligned appropriately.     Extraocular Movements: Extraocular movements intact.     Conjunctiva/sclera: Conjunctivae normal.     Pupils: Pupils are equal, round, and reactive to light.  Neck:     Thyroid: No thyroid mass, thyromegaly or thyroid tenderness.  Cardiovascular:     Rate and Rhythm: Normal rate and regular rhythm.     Pulses: Normal pulses.          Radial pulses are 2+ on the right side and 2+ on the left side.     Heart sounds: Normal heart sounds. No murmur heard.    No friction rub. No gallop.  Pulmonary:     Effort: Pulmonary effort is normal.     Breath sounds: Normal breath sounds. No decreased air movement. No decreased breath sounds, wheezing, rhonchi or rales.  Abdominal:     General: Abdomen is flat. Bowel sounds are normal.     Palpations: Abdomen is soft.  Musculoskeletal:     Cervical back: Normal range of motion and neck supple.     Right lower leg: No edema.     Left lower leg: No edema.  Lymphadenopathy:     Head:     Right  side of head: No submental, submandibular or preauricular adenopathy.     Left side of head: No submental, submandibular or preauricular adenopathy.     Cervical:     Right cervical: No superficial cervical adenopathy.    Left cervical: No superficial cervical adenopathy.     Upper Body:     Right upper body: No supraclavicular adenopathy.     Left upper body: No supraclavicular adenopathy.  Skin:    General: Skin is warm and dry.  Neurological:     General: No focal deficit present.     Mental Status: She is alert and oriented to person, place, and time.     GCS: GCS eye subscore is 4. GCS verbal subscore is 5. GCS motor subscore is 6.     Cranial Nerves: No cranial nerve deficit, dysarthria or facial asymmetry.     Motor: No weakness, tremor, atrophy or abnormal muscle tone.     Gait: Gait is intact.  Psychiatric:        Attention and Perception: Attention and perception normal.        Mood and Affect:  Mood and affect normal.        Speech: Speech normal.        Behavior: Behavior normal. Behavior is cooperative.        Thought Content: Thought content normal.      No results found for this or any previous visit (from the past 2160 hour(s)).   Fall Risk:    09/02/2023   10:06 AM 07/26/2023    9:12 AM 02/26/2023    9:50 AM 08/28/2022    9:19 AM 04/10/2022    8:57 AM  Fall Risk   Falls in the past year? 0 0 0 0 0  Number falls in past yr: 0 0 0 0 0  Injury with Fall? 0 0 0 0 0  Risk for fall due to : No Fall Risks No Fall Risks No Fall Risks No Fall Risks No Fall Risks  Follow up Falls evaluation completed Falls evaluation completed Falls evaluation completed Falls evaluation completed Falls evaluation completed     Functional Status Survey:     Assessment & Plan  Problem List Items Addressed This Visit       Cardiovascular and Mediastinum   Hypertension    Chronic, historic condition Appears controlled with current medication regimen comprised of Losartan- hydrochlorothiazide 50-12.5 mg PO every day and metoprolol 25 mg PO every day Continue current regimen, Refills provided today for Losartan- hydrochlorothiazide as Metoprolol is managed by Cardiology  Continue checking BP at home for monitoring  Follow up in 6 months or sooner if concerns arise        Relevant Medications   losartan-hydrochlorothiazide (HYZAAR) 50-12.5 MG tablet   Atrial fibrillation (HCC)    Chronic, historic condition, ongoing  She is seen by Cardiology for monitoring - current taking Eliquis 5 mg PO BID and Metoprolol 25 mg PO every day per Cardiology management  Continue collaboration with specialty  Follow up in 6 months or sooner if concerns arise        Relevant Medications   losartan-hydrochlorothiazide (HYZAAR) 50-12.5 MG tablet     Other   Depression    Chronic, historic condition Appears well managed on current regimen comprised of Celexa 40 mg PO every day - refills provided  today Continue current regimen Follow up in 6 months or sooner if concerns arise        Relevant Medications  citalopram (CELEXA) 40 MG tablet   Hyperlipidemia, mixed    Chronic, historic condition  Appears well controlled on current regimen comprised of Atorvastatin 10 mg PO every day - managed by Cardiology  Repeat lipid panel today- results to dictate further management Follow up in 6 months or sooner if concerns arise        Relevant Medications   losartan-hydrochlorothiazide (HYZAAR) 50-12.5 MG tablet   Other Relevant Orders   Lipid Profile   Other Visit Diagnoses     Annual physical exam    -  Primary -USPSTF grade A and B recommendations reviewed with patient; age-appropriate recommendations, preventive care, screening tests, etc discussed and encouraged; healthy living encouraged; see AVS for patient education given to patient -Discussed importance of 150 minutes of physical activity weekly, eat two servings of fish weekly, eat one serving of tree nuts ( cashews, pistachios, pecans, almonds.Marland Kitchen) every other day, eat 6 servings of fruit/vegetables daily and drink plenty of water and avoid sweet beverages.   -Reviewed Health Maintenance: Yes.     Relevant Orders   Comp Met (CMET)   CBC w/Diff   Lipid Profile   TSH   T4, free   HgB A1c        Return in about 6 months (around 03/01/2024) for HLD, HTN, Depression.   I, Madeleine Fenn E Danilyn Cocke, PA-C, have reviewed all documentation for this visit. The documentation on 09/02/23 for the exam, diagnosis, procedures, and orders are all accurate and complete.   Jacquelin Hawking, MHS, PA-C [facility]

## 2023-09-03 LAB — COMPREHENSIVE METABOLIC PANEL
ALT: 22 IU/L (ref 0–32)
AST: 23 IU/L (ref 0–40)
Albumin: 4.4 g/dL (ref 3.8–4.8)
Alkaline Phosphatase: 57 IU/L (ref 44–121)
BUN/Creatinine Ratio: 18 (ref 12–28)
BUN: 13 mg/dL (ref 8–27)
Bilirubin Total: 0.8 mg/dL (ref 0.0–1.2)
CO2: 24 mmol/L (ref 20–29)
Calcium: 9.6 mg/dL (ref 8.7–10.3)
Chloride: 101 mmol/L (ref 96–106)
Creatinine, Ser: 0.71 mg/dL (ref 0.57–1.00)
Globulin, Total: 2.2 g/dL (ref 1.5–4.5)
Glucose: 87 mg/dL (ref 70–99)
Potassium: 4.1 mmol/L (ref 3.5–5.2)
Sodium: 139 mmol/L (ref 134–144)
Total Protein: 6.6 g/dL (ref 6.0–8.5)
eGFR: 89 mL/min/{1.73_m2} (ref >59)

## 2023-09-03 LAB — CBC WITH DIFFERENTIAL/PLATELET
Basophils Absolute: 0 10*3/uL (ref 0.0–0.2)
Basos: 1 %
EOS (ABSOLUTE): 0.1 10*3/uL (ref 0.0–0.4)
Eos: 2 %
Hematocrit: 44.6 % (ref 34.0–46.6)
Hemoglobin: 14.3 g/dL (ref 11.1–15.9)
Immature Grans (Abs): 0 10*3/uL (ref 0.0–0.1)
Immature Granulocytes: 0 %
Lymphocytes Absolute: 1.8 10*3/uL (ref 0.7–3.1)
Lymphs: 30 %
MCH: 30 pg (ref 26.6–33.0)
MCHC: 32.1 g/dL (ref 31.5–35.7)
MCV: 94 fL (ref 79–97)
Monocytes Absolute: 0.4 10*3/uL (ref 0.1–0.9)
Monocytes: 7 %
Neutrophils Absolute: 3.5 10*3/uL (ref 1.4–7.0)
Neutrophils: 60 %
Platelets: 274 10*3/uL (ref 150–450)
RBC: 4.77 x10E6/uL (ref 3.77–5.28)
RDW: 12.6 % (ref 11.7–15.4)
WBC: 5.8 10*3/uL (ref 3.4–10.8)

## 2023-09-03 LAB — LIPID PANEL
Chol/HDL Ratio: 2.2 ratio (ref 0.0–4.4)
Cholesterol, Total: 128 mg/dL (ref 100–199)
HDL: 57 mg/dL (ref >39)
LDL Chol Calc (NIH): 54 mg/dL (ref 0–99)
Triglycerides: 89 mg/dL (ref 0–149)
VLDL Cholesterol Cal: 17 mg/dL (ref 5–40)

## 2023-09-03 LAB — TSH: TSH: 1.43 u[IU]/mL (ref 0.450–4.500)

## 2023-09-03 LAB — T4, FREE: Free T4: 1.15 ng/dL (ref 0.82–1.77)

## 2023-09-03 LAB — HEMOGLOBIN A1C
Est. average glucose Bld gHb Est-mCnc: 123 mg/dL
Hgb A1c MFr Bld: 5.9 % — ABNORMAL HIGH (ref 4.8–5.6)

## 2023-09-05 ENCOUNTER — Telehealth: Payer: Self-pay | Admitting: Nurse Practitioner

## 2023-09-05 NOTE — Telephone Encounter (Signed)
Pt is calling to see if labs from 09/02/23 have been resulted? Please advise  CB- [phone]

## 2023-09-06 NOTE — Progress Notes (Signed)
Your labs are back Your A1c was 5.9 which is in the prediabetic range. No medications are indicated at this time but I do recommend reducing your sugar and carb intake and making sure you are exercising regularly Your electrolytes, liver and kidney function were overall normal at this time Your CBC was normal- no signs of anemia Your cholesterol looks great Your thyroid testing was normal

## 2023-09-25 DIAGNOSIS — H35371 Puckering of macula, right eye: Secondary | ICD-10-CM | POA: Diagnosis not present

## 2023-09-25 DIAGNOSIS — H353220 Exudative age-related macular degeneration, left eye, stage unspecified: Secondary | ICD-10-CM | POA: Diagnosis not present

## 2023-09-25 DIAGNOSIS — H2513 Age-related nuclear cataract, bilateral: Secondary | ICD-10-CM | POA: Diagnosis not present

## 2023-09-25 DIAGNOSIS — H353111 Nonexudative age-related macular degeneration, right eye, early dry stage: Secondary | ICD-10-CM | POA: Diagnosis not present

## 2023-10-31 DIAGNOSIS — I10 Essential (primary) hypertension: Secondary | ICD-10-CM | POA: Diagnosis not present

## 2023-10-31 DIAGNOSIS — I480 Paroxysmal atrial fibrillation: Secondary | ICD-10-CM | POA: Diagnosis not present

## 2023-10-31 DIAGNOSIS — R9431 Abnormal electrocardiogram [ECG] [EKG]: Secondary | ICD-10-CM | POA: Diagnosis not present

## 2023-10-31 DIAGNOSIS — I340 Nonrheumatic mitral (valve) insufficiency: Secondary | ICD-10-CM | POA: Diagnosis not present

## 2023-10-31 DIAGNOSIS — Z87891 Personal history of nicotine dependence: Secondary | ICD-10-CM | POA: Diagnosis not present

## 2023-10-31 DIAGNOSIS — Z880 Allergy status to penicillin: Secondary | ICD-10-CM | POA: Diagnosis not present

## 2023-10-31 DIAGNOSIS — Z7901 Long term (current) use of anticoagulants: Secondary | ICD-10-CM | POA: Diagnosis not present

## 2023-10-31 DIAGNOSIS — Z882 Allergy status to sulfonamides status: Secondary | ICD-10-CM | POA: Diagnosis not present

## 2023-10-31 DIAGNOSIS — E785 Hyperlipidemia, unspecified: Secondary | ICD-10-CM | POA: Diagnosis not present

## 2023-10-31 DIAGNOSIS — I4811 Longstanding persistent atrial fibrillation: Secondary | ICD-10-CM | POA: Diagnosis not present

## 2023-11-04 ENCOUNTER — Encounter: Payer: Self-pay | Admitting: Nurse Practitioner

## 2023-11-04 DIAGNOSIS — I4811 Longstanding persistent atrial fibrillation: Secondary | ICD-10-CM | POA: Diagnosis not present

## 2023-11-04 DIAGNOSIS — Z1231 Encounter for screening mammogram for malignant neoplasm of breast: Secondary | ICD-10-CM | POA: Diagnosis not present

## 2023-11-04 LAB — HM MAMMOGRAPHY

## 2023-12-04 DIAGNOSIS — H353221 Exudative age-related macular degeneration, left eye, with active choroidal neovascularization: Secondary | ICD-10-CM | POA: Diagnosis not present

## 2024-01-01 ENCOUNTER — Other Ambulatory Visit: Payer: Self-pay | Admitting: Physician Assistant

## 2024-01-02 NOTE — Telephone Encounter (Signed)
Requested medication (s) are due for refill today: yes  Requested medication (s) are on the active medication list: yes  Last refill:  09/02/23 #90 1 refills  Future visit scheduled: yes in 2 months   Notes to clinic:   last ordered by E. Mecum, PA. Do you want to refill Rx?     Requested Prescriptions  Pending Prescriptions Disp Refills   baclofen (LIORESAL) 10 MG tablet [Pharmacy Med Name: BACLOFEN 10 MG TABLET] 90 tablet 1    Sig: TAKE 1 TABLET BY MOUTH EVERYDAY AT BEDTIME     Analgesics:  Muscle Relaxants - baclofen Passed - 01/02/2024  9:21 AM      Passed - Cr in normal range and within 180 days    Creatinine, Ser  Date Value Ref Range Status  09/02/2023 0.71 0.57 - 1.00 mg/dL Final         Passed - eGFR is 30 or above and within 180 days    GFR calc Af Amer  Date Value Ref Range Status  12/05/2020 102 >59 mL/min/1.73 Final    Comment:    **In accordance with recommendations from the NKF-ASN Task force,**   [facility] is in the process of updating its eGFR calculation to the   2021 CKD-EPI creatinine equation that estimates kidney function   without a race variable.    GFR, Estimated  Date Value Ref Range Status  05/18/2021 >60 >60 mL/min Final    Comment:    (NOTE) Calculated using the CKD-EPI Creatinine Equation (2021)    eGFR  Date Value Ref Range Status  09/02/2023 89 >59 mL/min/1.73 Final         Passed - Valid encounter within last 6 months    Recent Outpatient Visits           4 months ago Annual physical exam   [place] [facility] Mecum, Erin E, PA-C   5 months ago Swelling of toe of left foot   [place] [facility] [place], Sherran Needs, NP   10 months ago Acute nonintractable headache, unspecified headache type   [facility] Larae Grooms, NP   10 months ago Encounter for annual wellness exam in Medicare patient   [place] [facility] Larae Grooms, NP   1 year  ago Annual physical exam   [place] [facility] Larae Grooms, NP       Future Appointments             In 2 months Larae Grooms, NP [facility]

## 2024-01-29 DIAGNOSIS — H353221 Exudative age-related macular degeneration, left eye, with active choroidal neovascularization: Secondary | ICD-10-CM | POA: Diagnosis not present

## 2024-03-02 ENCOUNTER — Ambulatory Visit (INDEPENDENT_AMBULATORY_CARE_PROVIDER_SITE_OTHER): Payer: Medicare HMO | Admitting: Nurse Practitioner

## 2024-03-02 ENCOUNTER — Encounter: Payer: Self-pay | Admitting: Nurse Practitioner

## 2024-03-02 VITALS — BP 129/74 | HR 67 | Temp 98.4°F | Ht 66.0 in | Wt 163.2 lb

## 2024-03-02 DIAGNOSIS — R7303 Prediabetes: Secondary | ICD-10-CM | POA: Diagnosis not present

## 2024-03-02 DIAGNOSIS — D6869 Other thrombophilia: Secondary | ICD-10-CM

## 2024-03-02 DIAGNOSIS — F325 Major depressive disorder, single episode, in full remission: Secondary | ICD-10-CM | POA: Diagnosis not present

## 2024-03-02 DIAGNOSIS — H353230 Exudative age-related macular degeneration, bilateral, stage unspecified: Secondary | ICD-10-CM | POA: Diagnosis not present

## 2024-03-02 DIAGNOSIS — E782 Mixed hyperlipidemia: Secondary | ICD-10-CM

## 2024-03-02 DIAGNOSIS — I480 Paroxysmal atrial fibrillation: Secondary | ICD-10-CM | POA: Diagnosis not present

## 2024-03-02 DIAGNOSIS — I10 Essential (primary) hypertension: Secondary | ICD-10-CM

## 2024-03-02 MED ORDER — LOSARTAN POTASSIUM-HCTZ 50-12.5 MG PO TABS: 1.0000 | ORAL_TABLET | Freq: Every day | ORAL | 1 refills | Status: DC

## 2024-03-02 MED ORDER — CITALOPRAM HYDROBROMIDE 40 MG PO TABS: 40.0000 mg | ORAL_TABLET | Freq: Every day | ORAL | 1 refills | Status: DC

## 2024-03-02 MED ORDER — BACLOFEN 10 MG PO TABS: 10.0000 mg | ORAL_TABLET | Freq: Every day | ORAL | 1 refills | Status: DC

## 2024-03-02 NOTE — Assessment & Plan Note (Signed)
 Receiving injections every 8 weeks.

## 2024-03-02 NOTE — Assessment & Plan Note (Signed)
 Labs ordered at visit today.  Will make recommendations based on lab results.

## 2024-03-02 NOTE — Assessment & Plan Note (Signed)
Chronic.  Controlled.  Continue with current medication regimen on Atorvastatin daily.  Refills sent today.  Labs ordered today.  Return to clinic in 6 months for reevaluation.  Call sooner if concerns arise.   

## 2024-03-02 NOTE — Assessment & Plan Note (Signed)
 Chronic.  Controlled.  Continue with current medication regimen on Losartan, HCTZ and Metoprolol.  Checks blood pressure at home and runs 120/70s.  Recommend calling the office if blood pressure is >140/90.  Refills sent today.  Labs ordered today.  Return to clinic in 6 months for reevaluation.  Call sooner if concerns arise.

## 2024-03-02 NOTE — Assessment & Plan Note (Signed)
 Chronic.  Controlled.  Continue with current medication regimen of Eliquis and Metoprolol. Continue to follow up with Cardiology.  Labs ordered today.  Return to clinic in 6 months for reevaluation.  Call sooner if concerns arise.

## 2024-03-02 NOTE — Assessment & Plan Note (Signed)
Chronic.  Controlled.  Continue with current medication regimen of Celexa daily.  Refills sent today.  Labs ordered today.  Return to clinic in 6 months for reevaluation.  Call sooner if concerns arise.

## 2024-03-02 NOTE — Progress Notes (Signed)
 BP 129/74   Pulse 67   Temp 98.4 F (36.9 C) (Oral)   Ht 5\' 6"  (1.676 m)   Wt 163 lb 3.2 oz (74 kg)   LMP 01/03/1999 (Approximate)   SpO2 98%   BMI 26.34 kg/m    Subjective:    Patient ID: Melanie Wallace, female    DOB: 08-Jan-1946, 78 y.o.   MRN: [id number]  HPI: Melanie Wallace is a 78 y.o. female  Chief Complaint  Patient presents with   Anxiety   Hyperlipidemia   Hypertension   Depression   HYPERTENSION / HYPERLIPIDEMIA Satisfied with current treatment? yes Duration of hypertension: years BP monitoring frequency: a few times a week BP range:120/80  BP medication side effects: no Past BP meds: Losartan-hydrochlorothiazide 50-12.5 mg PO every day, metoprolol 25 mg PO every day  Duration of hyperlipidemia: years Cholesterol medication side effects: no Cholesterol supplements: none Past cholesterol medications: atorvastain (lipitor) Medication compliance: good compliance Aspirin: no Recent stressors: no Recurrent headaches: no Visual changes: no Palpitations: no Dyspnea: no Chest pain: no Lower extremity edema: no Dizzy/lightheaded: no  DEPRESSION Reports she is doing well. Feels like the Celexa is working well for her.  Denies concerns regarding medication or depression at visit today.      Relevant past medical, surgical, family and social history reviewed and updated as indicated. Interim medical history since our last visit reviewed. Allergies and medications reviewed and updated.  Review of Systems  Eyes:  Negative for visual disturbance.  Respiratory:  Negative for cough, chest tightness and shortness of breath.   Cardiovascular:  Negative for chest pain, palpitations and leg swelling.  Neurological:  Negative for dizziness and headaches.  Psychiatric/Behavioral:  Positive for dysphoric mood. Negative for suicidal ideas.     Per HPI unless specifically indicated above     Objective:    BP 129/74   Pulse 67   Temp 98.4 F (36.9 C) (Oral)    Ht 5\' 6"  (1.676 m)   Wt 163 lb 3.2 oz (74 kg)   LMP 01/03/1999 (Approximate)   SpO2 98%   BMI 26.34 kg/m   Wt Readings from Last 3 Encounters:  03/02/24 163 lb 3.2 oz (74 kg)  09/02/23 161 lb 12.8 oz (73.4 kg)  07/26/23 162 lb 6.4 oz (73.7 kg)    Physical Exam Vitals and nursing note reviewed.  Constitutional:      General: She is not in acute distress.    Appearance: Normal appearance. She is normal weight. She is not ill-appearing, toxic-appearing or diaphoretic.  HENT:     Head: Normocephalic.     Right Ear: External ear normal.     Left Ear: External ear normal.     Nose: Nose normal.     Mouth/Throat:     Mouth: Mucous membranes are moist.     Pharynx: Oropharynx is clear.  Eyes:     General:        Right eye: No discharge.        Left eye: No discharge.     Extraocular Movements: Extraocular movements intact.     Conjunctiva/sclera: Conjunctivae normal.     Pupils: Pupils are equal, round, and reactive to light.  Cardiovascular:     Rate and Rhythm: Normal rate and regular rhythm.     Heart sounds: No murmur heard. Pulmonary:     Effort: Pulmonary effort is normal. No respiratory distress.     Breath sounds: Normal breath sounds. No wheezing or rales.  Musculoskeletal:     Cervical back: Normal range of motion and neck supple.  Skin:    General: Skin is warm and dry.     Capillary Refill: Capillary refill takes less than 2 seconds.  Neurological:     General: No focal deficit present.     Mental Status: She is alert and oriented to person, place, and time. Mental status is at baseline.  Psychiatric:        Mood and Affect: Mood normal.        Behavior: Behavior normal.        Thought Content: Thought content normal.        Judgment: Judgment normal.     Results for orders placed or performed in visit on 11/04/23  HM MAMMOGRAPHY   Collection Time: 11/04/23 12:45 PM  Result Value Ref Range   HM Mammogram 0-4 Bi-Rad 0-4 Bi-Rad, Self Reported Normal       Assessment & Plan:   Problem List Items Addressed This Visit       Cardiovascular and Mediastinum   Hypertension   Chronic.  Controlled.  Continue with current medication regimen on Losartan, HCTZ and Metoprolol.  Checks blood pressure at home and runs 120/70s.  Recommend calling the office if blood pressure is >140/90.  Refills sent today.  Labs ordered today.  Return to clinic in 6 months for reevaluation.  Call sooner if concerns arise.       Relevant Medications   losartan-hydrochlorothiazide (HYZAAR) 50-12.5 MG tablet   Other Relevant Orders   Comp Met (CMET)   Atrial fibrillation (HCC) - Primary   Chronic.  Controlled.  Continue with current medication regimen of Eliquis and Metoprolol. Continue to follow up with Cardiology.  Labs ordered today.  Return to clinic in 6 months for reevaluation.  Call sooner if concerns arise.       Relevant Medications   losartan-hydrochlorothiazide (HYZAAR) 50-12.5 MG tablet     Hematopoietic and Hemostatic   Other thrombophilia (HCC)   Labs ordered at visit today.  Will make recommendations based on lab results.        Relevant Orders   CBC w/Diff     Other   Hyperlipidemia, mixed   Chronic.  Controlled.  Continue with current medication regimen on Atorvastatin daily.  Refills sent today.  Labs ordered today.  Return to clinic in 6 months for reevaluation.  Call sooner if concerns arise.       Relevant Medications   losartan-hydrochlorothiazide (HYZAAR) 50-12.5 MG tablet   Other Relevant Orders   Lipid Profile   Macular degeneration, wet (HCC)   Receiving injections every 8 weeks.        Depression, major, single episode, complete remission (HCC)   Chronic.  Controlled.  Continue with current medication regimen of Celexa daily.  Refills sent today.  Labs ordered today.  Return to clinic in 6 months for reevaluation.  Call sooner if concerns arise.        Relevant Medications   citalopram (CELEXA) 40 MG tablet   RESOLVED:  Depression   Relevant Medications   citalopram (CELEXA) 40 MG tablet   Other Visit Diagnoses       Prediabetes       Relevant Orders   HgB A1c        Follow up plan: No follow-ups on file.

## 2024-03-03 LAB — COMPREHENSIVE METABOLIC PANEL
ALT: 15 IU/L (ref 0–32)
AST: 19 IU/L (ref 0–40)
Albumin: 4.5 g/dL (ref 3.8–4.8)
Alkaline Phosphatase: 58 IU/L (ref 44–121)
BUN/Creatinine Ratio: 23 (ref 12–28)
BUN: 15 mg/dL (ref 8–27)
Bilirubin Total: 0.8 mg/dL (ref 0.0–1.2)
CO2: 23 mmol/L (ref 20–29)
Calcium: 9.8 mg/dL (ref 8.7–10.3)
Chloride: 102 mmol/L (ref 96–106)
Creatinine, Ser: 0.66 mg/dL (ref 0.57–1.00)
Globulin, Total: 2.3 g/dL (ref 1.5–4.5)
Glucose: 82 mg/dL (ref 70–99)
Potassium: 4.3 mmol/L (ref 3.5–5.2)
Sodium: 141 mmol/L (ref 134–144)
Total Protein: 6.8 g/dL (ref 6.0–8.5)
eGFR: 91 mL/min/{1.73_m2} (ref >59)

## 2024-03-03 LAB — CBC WITH DIFFERENTIAL/PLATELET
Basophils Absolute: 0 10*3/uL (ref 0.0–0.2)
Basos: 1 %
EOS (ABSOLUTE): 0.1 10*3/uL (ref 0.0–0.4)
Eos: 2 %
Hematocrit: 43 % (ref 34.0–46.6)
Hemoglobin: 14.2 g/dL (ref 11.1–15.9)
Immature Grans (Abs): 0 10*3/uL (ref 0.0–0.1)
Immature Granulocytes: 0 %
Lymphocytes Absolute: 1.9 10*3/uL (ref 0.7–3.1)
Lymphs: 30 %
MCH: 29.6 pg (ref 26.6–33.0)
MCHC: 33 g/dL (ref 31.5–35.7)
MCV: 90 fL (ref 79–97)
Monocytes Absolute: 0.5 10*3/uL (ref 0.1–0.9)
Monocytes: 8 %
Neutrophils Absolute: 3.6 10*3/uL (ref 1.4–7.0)
Neutrophils: 59 %
Platelets: 262 10*3/uL (ref 150–450)
RBC: 4.79 x10E6/uL (ref 3.77–5.28)
RDW: 12.4 % (ref 11.7–15.4)
WBC: 6.1 10*3/uL (ref 3.4–10.8)

## 2024-03-03 LAB — LIPID PANEL
Chol/HDL Ratio: 2 ratio (ref 0.0–4.4)
Cholesterol, Total: 118 mg/dL (ref 100–199)
HDL: 60 mg/dL (ref >39)
LDL Chol Calc (NIH): 42 mg/dL (ref 0–99)
Triglycerides: 79 mg/dL (ref 0–149)
VLDL Cholesterol Cal: 16 mg/dL (ref 5–40)

## 2024-03-03 LAB — HEMOGLOBIN A1C
Est. average glucose Bld gHb Est-mCnc: 114 mg/dL
Hgb A1c MFr Bld: 5.6 % (ref 4.8–5.6)

## 2024-03-25 DIAGNOSIS — H353221 Exudative age-related macular degeneration, left eye, with active choroidal neovascularization: Secondary | ICD-10-CM | POA: Diagnosis not present

## 2024-03-29 ENCOUNTER — Other Ambulatory Visit: Payer: Self-pay | Admitting: Physician Assistant

## 2024-03-31 NOTE — Telephone Encounter (Signed)
 Requested Prescriptions  Pending Prescriptions Disp Refills   cetirizine (ZYRTEC) 5 MG tablet [Pharmacy Med Name: CETIRIZINE HCL 5 MG TABLET] 90 tablet 1    Sig: TAKE 1 TABLET (5 MG TOTAL) BY MOUTH DAILY.     Ear, Nose, and Throat:  Antihistamines 2 Passed - 03/31/2024  8:37 AM      Passed - Cr in normal range and within 360 days    Creatinine, Ser  Date Value Ref Range Status  03/02/2024 0.66 0.57 - 1.00 mg/dL Final         Passed - Valid encounter within last 12 months    Recent Outpatient Visits           4 weeks ago Paroxysmal atrial fibrillation [facility])   [facility] Aileen Alexanders, NP

## 2024-05-20 DIAGNOSIS — H353221 Exudative age-related macular degeneration, left eye, with active choroidal neovascularization: Secondary | ICD-10-CM | POA: Diagnosis not present

## 2024-07-13 DIAGNOSIS — H353221 Exudative age-related macular degeneration, left eye, with active choroidal neovascularization: Secondary | ICD-10-CM | POA: Diagnosis not present

## 2024-08-24 ENCOUNTER — Other Ambulatory Visit: Payer: Self-pay | Admitting: Nurse Practitioner

## 2024-08-24 DIAGNOSIS — H353221 Exudative age-related macular degeneration, left eye, with active choroidal neovascularization: Secondary | ICD-10-CM | POA: Diagnosis not present

## 2024-08-25 NOTE — Telephone Encounter (Signed)
 Requested Prescriptions  Pending Prescriptions Disp Refills   citalopram  (CELEXA ) 40 MG tablet [Pharmacy Med Name: CITALOPRAM  HBR 40 MG TABLET] 90 tablet 1    Sig: TAKE 1 TABLET BY MOUTH EVERY DAY     Psychiatry:  Antidepressants - SSRI Passed - 08/25/2024 10:19 AM      Passed - Completed PHQ-2 or PHQ-9 in the last 360 days      Passed - Valid encounter within last 6 months    Recent Outpatient Visits           5 months ago Paroxysmal atrial fibrillation [facility])   [facility] Melvin Pao, NP               baclofen  (LIORESAL ) 10 MG tablet [Pharmacy Med Name: BACLOFEN  10 MG TABLET] 90 tablet 1    Sig: TAKE 1 TABLET BY MOUTH EVERYDAY AT BEDTIME     Analgesics:  Muscle Relaxants - baclofen  Passed - 08/25/2024 10:19 AM      Passed - Cr in normal range and within 180 days    Creatinine, Ser  Date Value Ref Range Status  03/02/2024 0.66 0.57 - 1.00 mg/dL Final         Passed - eGFR is 30 or above and within 180 days    GFR calc Af Amer  Date Value Ref Range Status  12/05/2020 102 >59 mL/min/1.73 Final    Comment:    **In accordance with recommendations from the NKF-ASN Task force,**   [facility] is in the process of updating its eGFR calculation to the   2021 CKD-EPI creatinine equation that estimates kidney function   without a race variable.    GFR, Estimated  Date Value Ref Range Status  05/18/2021 >60 >60 mL/min Final    Comment:    (NOTE) Calculated using the CKD-EPI Creatinine Equation (2021)    eGFR  Date Value Ref Range Status  03/02/2024 91 >59 mL/min/1.73 Final         Passed - Valid encounter within last 6 months    Recent Outpatient Visits           5 months ago Paroxysmal atrial fibrillation  [facility])   [facility] Melvin Pao, NP

## 2024-09-02 ENCOUNTER — Telehealth: Payer: Self-pay

## 2024-09-02 NOTE — Telephone Encounter (Signed)
 Copied from CRM #[id number]. Topic: Appointments - Appointment Cancel/Reschedule >> Sep 02, 2024  8:55 AM Tobias CROME wrote: Patient/patient representative is calling to cancel or reschedule an appointment. Refer to attachments for appointment information.   Patient canceled AWV. Patient states she does not ever want to schedule AWV. Patient called insurance and was informed AWV is not necessary to have. Patient states she felt misled as she was informed that it was optional. Patient would like this placed in her file so that AWV are not offered to her anymore. >> Sep 02, 2024  9:56 AM Nurse Alexandro Lesli ORN wrote: RICK

## 2024-09-07 ENCOUNTER — Encounter: Payer: Self-pay | Admitting: Nurse Practitioner

## 2024-09-07 ENCOUNTER — Ambulatory Visit: Admitting: Nurse Practitioner

## 2024-09-07 VITALS — BP 131/73 | HR 76 | Temp 98.1°F | Ht 66.5 in | Wt 159.6 lb

## 2024-09-07 DIAGNOSIS — Z0000 Encounter for general adult medical examination without abnormal findings: Secondary | ICD-10-CM | POA: Diagnosis not present

## 2024-09-07 DIAGNOSIS — D6869 Other thrombophilia: Secondary | ICD-10-CM

## 2024-09-07 DIAGNOSIS — I10 Essential (primary) hypertension: Secondary | ICD-10-CM

## 2024-09-07 DIAGNOSIS — E782 Mixed hyperlipidemia: Secondary | ICD-10-CM | POA: Diagnosis not present

## 2024-09-07 DIAGNOSIS — I480 Paroxysmal atrial fibrillation: Secondary | ICD-10-CM

## 2024-09-07 DIAGNOSIS — H353230 Exudative age-related macular degeneration, bilateral, stage unspecified: Secondary | ICD-10-CM

## 2024-09-07 DIAGNOSIS — R8290 Unspecified abnormal findings in urine: Secondary | ICD-10-CM

## 2024-09-07 DIAGNOSIS — F325 Major depressive disorder, single episode, in full remission: Secondary | ICD-10-CM

## 2024-09-07 DIAGNOSIS — Z23 Encounter for immunization: Secondary | ICD-10-CM | POA: Diagnosis not present

## 2024-09-07 LAB — URINALYSIS, ROUTINE W REFLEX MICROSCOPIC
Bilirubin, UA: NEGATIVE
Glucose, UA: NEGATIVE
Ketones, UA: NEGATIVE
Leukocytes,UA: NEGATIVE
Nitrite, UA: NEGATIVE
Protein,UA: NEGATIVE
Specific Gravity, UA: 1.015 (ref 1.005–1.030)
Urobilinogen, Ur: 0.2 mg/dL (ref 0.2–1.0)
pH, UA: 7 (ref 5.0–7.5)

## 2024-09-07 LAB — MICROSCOPIC EXAMINATION
Bacteria, UA: NONE SEEN
WBC, UA: NONE SEEN /HPF (ref 0–5)

## 2024-09-07 MED ORDER — CITALOPRAM HYDROBROMIDE 40 MG PO TABS: 40.0000 mg | ORAL_TABLET | Freq: Every day | ORAL | 1 refills | Status: AC

## 2024-09-07 MED ORDER — CETIRIZINE HCL 5 MG PO TABS: 5.0000 mg | ORAL_TABLET | Freq: Every day | ORAL | 1 refills | Status: DC

## 2024-09-07 MED ORDER — CITALOPRAM HYDROBROMIDE 40 MG PO TABS: 40.0000 mg | ORAL_TABLET | Freq: Every day | ORAL | 1 refills | Status: DC

## 2024-09-07 MED ORDER — METOPROLOL SUCCINATE ER 25 MG PO TB24: 25.0000 mg | ORAL_TABLET | Freq: Every day | ORAL | 1 refills | Status: AC

## 2024-09-07 MED ORDER — LOSARTAN POTASSIUM-HCTZ 50-12.5 MG PO TABS: 1.0000 | ORAL_TABLET | Freq: Every day | ORAL | 1 refills | Status: DC

## 2024-09-07 MED ORDER — CETIRIZINE HCL 5 MG PO TABS: 5.0000 mg | ORAL_TABLET | Freq: Every day | ORAL | 1 refills | Status: AC

## 2024-09-07 MED ORDER — LOSARTAN POTASSIUM-HCTZ 50-12.5 MG PO TABS: 1.0000 | ORAL_TABLET | Freq: Every day | ORAL | 1 refills | Status: AC

## 2024-09-07 NOTE — Assessment & Plan Note (Signed)
 Receiving injections every 6-8 weeks.  Continues to follow up with specialist.

## 2024-09-07 NOTE — Assessment & Plan Note (Signed)
Chronic.  Controlled.  Continue with current medication regimen on Atorvastatin daily.  Refills sent today.  Labs ordered today.  Return to clinic in 6 months for reevaluation.  Call sooner if concerns arise.   

## 2024-09-07 NOTE — Progress Notes (Signed)
 Subjective:   Melanie Wallace is a 78 y.o. female who presents for Medicare Annual (Subsequent) preventive examination.  Visit Complete: In person  Patient Medicare AWV questionnaire was completed by the patient on 09/07/24; I have confirmed that all information answered by patient is correct and no changes since this date.  Cardiac Risk Factors include: advanced age (>48men, >30 women);hypertension;obesity (BMI >30kg/m2)     Objective:    Today's Vitals   09/07/24 1008 09/07/24 1042  BP: 131/73   Pulse: 76   Temp: 98.1 F (36.7 C)   TempSrc: Oral   SpO2: 98%   Weight: 159 lb 9.6 oz (72.4 kg)   Height: 5' 6.5 (1.689 m)   PainSc: 0-No pain 0-No pain   Body mass index is 25.37 kg/m.     09/07/2024   10:52 AM 06/29/2023   10:50 AM 05/18/2021   10:00 AM 02/05/2018    9:36 AM 02/07/2017    8:23 AM 02/04/2017   10:10 AM 01/31/2016   10:14 AM  Advanced Directives  Does Patient Have a Medical Advance Directive? No No No No  No  No  No   Would patient like information on creating a medical advance directive? No - Patient declined   Yes (MAU/Ambulatory/Procedural Areas - Information given)  No - Patient declined   Yes - Educational materials given      Data saved with a previous flowsheet row definition    Current Medications (verified) Outpatient Encounter Medications as of 09/07/2024  Medication Sig   apixaban (ELIQUIS) 5 MG TABS tablet Take 5 mg by mouth 2 (two) times daily.   atorvastatin (LIPITOR) 10 MG tablet Take 10 mg by mouth daily.   baclofen  (LIORESAL ) 10 MG tablet TAKE 1 TABLET BY MOUTH EVERYDAY AT BEDTIME   calcium-vitamin D (OSCAL WITH D) 500-200 MG-UNIT per tablet Take 1 tablet by mouth.   cyanocobalamin (VITAMIN B12) 1000 MCG tablet Take 1,000 mcg by mouth daily.   Multiple Vitamins-Minerals (PRESERVISION AREDS 2 PO) Take 1 tablet by mouth in the morning and at bedtime.   [DISCONTINUED] cetirizine  (ZYRTEC ) 5 MG tablet TAKE 1 TABLET (5 MG TOTAL) BY MOUTH DAILY.    [DISCONTINUED] citalopram  (CELEXA ) 40 MG tablet TAKE 1 TABLET BY MOUTH EVERY DAY   [DISCONTINUED] losartan -hydrochlorothiazide (HYZAAR) 50-12.5 MG tablet Take 1 tablet by mouth daily.   [DISCONTINUED] metoprolol  succinate (TOPROL -XL) 25 MG 24 hr tablet Take 25 mg by mouth daily.   cetirizine  (ZYRTEC ) 5 MG tablet Take 1 tablet (5 mg total) by mouth daily.   citalopram  (CELEXA ) 40 MG tablet Take 1 tablet (40 mg total) by mouth daily.   losartan -hydrochlorothiazide (HYZAAR) 50-12.5 MG tablet Take 1 tablet by mouth daily.   metoprolol  succinate (TOPROL -XL) 25 MG 24 hr tablet Take 1 tablet (25 mg total) by mouth daily.   [DISCONTINUED] cetirizine  (ZYRTEC ) 5 MG tablet Take 1 tablet (5 mg total) by mouth daily.   [DISCONTINUED] citalopram  (CELEXA ) 40 MG tablet Take 1 tablet (40 mg total) by mouth daily.   [DISCONTINUED] losartan -hydrochlorothiazide (HYZAAR) 50-12.5 MG tablet Take 1 tablet by mouth daily.   No facility-administered encounter medications on file as of 09/07/2024.    Allergies (verified) Sulfa antibiotics, Sulfasalazine, Green tea leaf ext, Pen-vee k [penicillin v], and Penicillin g benzathine   History: Past Medical History:  Diagnosis Date   Allergic rhinitis    Atrial fibrillation (HCC)    Breast CA (HCC)    Depression    Hypertension    Osteoporosis  Vaginal cancer [facility])    Past Surgical History:  Procedure Laterality Date   BREAST SURGERY  03/2015   NASAL SEPTUM SURGERY     TUBAL LIGATION     Family History  Problem Relation Age of Onset   Cancer Mother        breast and bone   Hypertension Mother    Cancer Father        prostate   Hypertension Sister    Cancer Brother    Hypertension Daughter    Hypothyroidism Daughter    Allergies Daughter    Hyperlipidemia Maternal Grandmother    Heart attack Maternal Grandmother    Stroke Paternal Grandfather    Social History   Socioeconomic History   Marital status: Married    Spouse name: Not on file   Number  of children: Not on file   Years of education: 12   Highest education level: High school graduate  Occupational History   Not on file  Tobacco Use   Smoking status: Former   Smokeless tobacco: Never   Tobacco comments:    quit in 95- smoked for 3 years total   Vaping Use   Vaping status: Never Used  Substance and Sexual Activity   Alcohol use: No    Alcohol/week: 0.0 standard drinks of alcohol   Drug use: No   Sexual activity: Yes  Other Topics Concern   Not on file  Social History Narrative   Not on file   Social Drivers of Health   Financial Resource Strain: Low Risk  (09/07/2024)   Overall Financial Resource Strain (CARDIA)    Difficulty of Paying Living Expenses: Not hard at all  Food Insecurity: No Food Insecurity (09/07/2024)   Hunger Vital Sign    Worried About Running Out of Food in the Last Year: Never true    Ran Out of Food in the Last Year: Never true  Transportation Needs: No Transportation Needs (09/07/2024)   PRAPARE - Administrator, Civil Service (Medical): No    Lack of Transportation (Non-Medical): No  Physical Activity: Insufficiently Active (09/07/2024)   Exercise Vital Sign    Days of Exercise per Week: 7 days    Minutes of Exercise per Session: 20 min  Stress: No Stress Concern Present (09/07/2024)   [employer] of Occupational Health - Occupational Stress Questionnaire    Feeling of Stress: Not at all  Social Connections: Moderately Integrated (09/07/2024)   Social Connection and Isolation Panel    Frequency of Communication with Friends and Family: More than three times a week    Frequency of Social Gatherings with Friends and Family: More than three times a week    Attends Religious Services: More than 4 times per year    Active Member of [employer] or Organizations: No    Attends Engineer, structural: Never    Marital Status: Married    Tobacco Counseling Counseling given: Not Answered Tobacco comments: quit in 95- smoked  for 3 years total    Clinical Intake:  Pre-visit preparation completed: Yes  Pain : No/denies pain Pain Score: 0-No pain     BMI - recorded: 25.38 Nutritional Status: BMI 25 -29 Overweight Nutritional Risks: None Diabetes: No  How often do you need to have someone help you when you read instructions, pamphlets, or other written materials from your doctor or pharmacy?: 1 - Never What is the last grade level you completed in school?: 12th grade  Interpreter Needed?: No  Activities of Daily Living    09/07/2024   10:50 AM  In your present state of health, do you have any difficulty performing the following activities:  Hearing? 0  Vision? 0  Difficulty concentrating or making decisions? 0  Walking or climbing stairs? 0  Dressing or bathing? 0  Doing errands, shopping? 0  Preparing Food and eating ? N  Using the Toilet? N  In the past six months, have you accidently leaked urine? N  Do you have problems with loss of bowel control? N  Managing your Medications? N  Managing your Finances? N  Housekeeping or managing your Housekeeping? N    Patient Care Team: Melvin Pao, NP as PCP - General Cleotilde Vina FALCON, MD as Referring Physician (Cardiology)  Indicate any recent Medical Services you may have received from other than [facility] providers in the past year (date may be approximate).     Assessment:   This is a routine wellness examination for [place].  Hearing/Vision screen No results found.   Goals Addressed   None    Depression Screen    09/07/2024   10:20 AM 03/02/2024   10:03 AM 09/02/2023   10:06 AM 07/26/2023    9:12 AM 02/26/2023    9:51 AM 08/28/2022    9:19 AM 04/10/2022    8:57 AM  PHQ 2/9 Scores  PHQ - 2 Score 0 0 0 0 0 0 1  PHQ- 9 Score 0 0 0 0 3 0 2    Fall Risk    09/07/2024   10:20 AM 03/02/2024   10:03 AM 09/02/2023   10:06 AM 07/26/2023    9:12 AM 02/26/2023    9:50 AM  Fall Risk   Falls in the past year? 0 0 0 0 0  Number falls in  past yr: 0 0 0 0 0  Injury with Fall? 0 0 0 0 0  Risk for fall due to : No Fall Risks No Fall Risks No Fall Risks No Fall Risks No Fall Risks  Follow up Falls evaluation completed Falls evaluation completed Falls evaluation completed Falls evaluation completed Falls evaluation completed    MEDICARE RISK AT HOME: Medicare Risk at Home Any stairs in or around the home?: Yes If so, are there any without handrails?: No Home free of loose throw rugs in walkways, pet beds, electrical cords, etc?: No Adequate lighting in your home to reduce risk of falls?: No Life alert?: No Use of a cane, walker or w/c?: No Grab bars in the bathroom?: Yes Shower chair or bench in shower?: Yes Elevated toilet seat or a handicapped toilet?: No  TIMED UP AND GO:  Was the test performed?  Yes  Length of time to ambulate 10 feet: 4 sec Gait steady and fast without use of assistive device    Cognitive Function:        09/07/2024   10:52 AM 02/05/2018    9:43 AM  6CIT Screen  What Year? 0 points 0 points  What month? 0 points 0 points  What time? 0 points 0 points  Count back from 20 0 points 0 points  Months in reverse 0 points 0 points  Repeat phrase 0 points 0 points  Total Score 0 points 0 points    Immunizations Immunization History  Administered Date(s) Administered   Fluad Quad(high Dose 65+) 08/26/2019, 08/29/2020   INFLUENZA, HIGH DOSE SEASONAL PF 02/05/2018, 09/01/2018, 09/07/2024   Influenza, Quadrivalent, Recombinant, Inj, Pf 09/20/2022   Influenza,inj,Quad PF,6+ Mos  10/10/2016   Influenza-Unspecified 08/24/2021, 09/03/2023   PFIZER(Purple Top)SARS-COV-2 Vaccination 02/11/2020, 03/08/2020, 11/04/2020   Pneumococcal Conjugate-13 07/05/2014   Pneumococcal Polysaccharide-23 12/30/2012, 12/20/2013   Td 12/17/2004   Td (Adult),5 Lf Tetanus Toxid, Preservative Free 04/15/2016   Tdap 04/15/2016, 07/31/2016   Zoster, Live 11/15/2011, 12/25/2011    TDAP status: Up to date  Flu Vaccine  status: Up to date  Pneumococcal vaccine status: Up to date  Covid-19 vaccine status: Completed vaccines  Qualifies for Shingles Vaccine? Yes   Zostavax completed Yes   Shingrix Completed?: No.    Education has been provided regarding the importance of this vaccine. Patient has been advised to call insurance company to determine out of pocket expense if they have not yet received this vaccine. Advised may also receive vaccine at local pharmacy or Health Dept. Verbalized acceptance and understanding.  Screening Tests Health Maintenance  Topic Date Due   COVID-19 Vaccine (4 - 2025-26 season) 09/23/2024 (Originally 08/17/2024)   Zoster Vaccines- Shingrix (1 of 2) 12/07/2024 (Originally 09/18/1965)   Medicare Annual Wellness (AWV)  09/07/2025   Mammogram  11/03/2025   DTaP/Tdap/Td (5 - Td or Tdap) 07/31/2026   Colonoscopy  07/05/2029   Pneumococcal Vaccine: 50+ Years  Completed   Influenza Vaccine  Completed   DEXA SCAN  Completed   Hepatitis C Screening  Completed   HPV VACCINES  Aged Out   Meningococcal B Vaccine  Aged Out    Health Maintenance  There are no preventive care reminders to display for this patient.  Colorectal cancer screening: Type of screening: Colonoscopy. Completed 07/06/19. Repeat every 10 years  Mammogram status: Completed 11/04/23. Repeat every year  Bone Density status: Completed 10/29/17. Results reflect: Bone density results: NORMAL. Repeat every 5 years.  Lung Cancer Screening: (Low Dose CT Chest recommended if Age 77-80 years, 20 pack-year currently smoking OR have quit w/in 15years.) does not qualify.   Lung Cancer Screening Referral: N/A  Additional Screening:  Hepatitis C Screening: does qualify; Completed 01/31/16  Vision Screening: Recommended annual ophthalmology exams for early detection of glaucoma and other disorders of the eye. Is the patient up to date with their annual eye exam?  Yes  Who is the provider or what is the name of the office  in which the patient attends annual eye exams? [facility] If pt is not established with a provider, would they like to be referred to a provider to establish care? N/A.   Dental Screening: Recommended annual dental exams for proper oral hygiene  Diabetic Foot Exam: N/A- patient not diabetic   Community Resource Referral / Chronic Care Management: CRR required this visit?  No   CCM required this visit?  No     Plan:     I have personally reviewed and noted the following in the patient's chart:   Medical and social history Use of alcohol, tobacco or illicit drugs  Current medications and supplements including opioid prescriptions. Patient is not currently taking opioid prescriptions. Functional ability and status Nutritional status Physical activity Advanced directives List of other physicians Hospitalizations, surgeries, and ER visits in previous 12 months Vitals Screenings to include cognitive, depression, and falls Referrals and appointments  In addition, I have reviewed and discussed with patient certain preventive protocols, quality metrics, and best practice recommendations. A written personalized care plan for preventive services as well as general preventive health recommendations were provided to patient.     Laymon LOISE [place], [place]   09/07/2024   After Visit Summary: (In Person-Printed)  AVS printed and given to the patient

## 2024-09-07 NOTE — Assessment & Plan Note (Signed)
 Chronic.  Controlled.  Continue with current medication regimen of Eliquis and Metoprolol. Continue to follow up with Cardiology.  Labs ordered today.  Return to clinic in 6 months for reevaluation.  Call sooner if concerns arise.

## 2024-09-07 NOTE — Progress Notes (Signed)
 BP 131/73   Pulse 76   Temp 98.1 F (36.7 C) (Oral)   Ht 5' 6.5 (1.689 m)   Wt 159 lb 9.6 oz (72.4 kg)   LMP 01/03/1999 (Approximate)   SpO2 98%   BMI 25.37 kg/m    Subjective:    Patient ID: Melanie Wallace, female    DOB: Apr 11, 1946, 78 y.o.   MRN: [id number]  HPI: Melanie Wallace is a 78 y.o. female presenting on 09/07/2024 for comprehensive medical examination. Current medical complaints include:none  She currently lives with: Menopausal Symptoms: no  HYPERTENSION / HYPERLIPIDEMIA Satisfied with current treatment? yes Duration of hypertension: years BP monitoring frequency: a few times a week BP range:120/80  BP medication side effects: no Past BP meds: Losartan -hydrochlorothiazide 50-12.5 mg PO every day, metoprolol  25 mg PO every day  Duration of hyperlipidemia: years Cholesterol medication side effects: no Cholesterol supplements: none Past cholesterol medications: atorvastain (lipitor) Medication compliance: good compliance Aspirin: no Recent stressors: no Recurrent headaches: no Visual changes: no Palpitations: no Dyspnea: no Chest pain: no Lower extremity edema: no Dizzy/lightheaded: no  DEPRESSION Reports she is doing well. Feels like the Celexa  is working well for her.  Denies concerns regarding medication or depression at visit today.    MACULAR DEGENERATION Receives injections and follows up with provider every 6 weeks.  Remains stable.    Patient states she has a lot of bubbles in her urine and would like to have that checked.   Depression Screen done today and results listed below:     09/07/2024   10:20 AM 03/02/2024   10:03 AM 09/02/2023   10:06 AM 07/26/2023    9:12 AM 02/26/2023    9:51 AM  Depression screen PHQ 2/9  Decreased Interest 0 0 0 0 0  Down, Depressed, Hopeless 0 0 0 0 0  PHQ - 2 Score 0 0 0 0 0  Altered sleeping 0 0 0 0 3  Tired, decreased energy 0 0 0 0 0  Change in appetite 0 0 0 0 0  Feeling bad or failure about  yourself  0 0 0 0 0  Trouble concentrating 0 0 0 0 0  Moving slowly or fidgety/restless 0 0 0 0 0  Suicidal thoughts 0 0 0 0 0  PHQ-9 Score 0 0 0 0 3  Difficult doing work/chores Not difficult at all Not difficult at all Not difficult at all Not difficult at all Not difficult at all    The patient does not have a history of falls. I did complete a risk assessment for falls. A plan of care for falls was documented.   Past Medical History:  Past Medical History:  Diagnosis Date   Allergic rhinitis    Atrial fibrillation (HCC)    Breast CA (HCC)    Depression    Hypertension    Osteoporosis    Vaginal cancer [facility])     Surgical History:  Past Surgical History:  Procedure Laterality Date   BREAST SURGERY  03/2015   NASAL SEPTUM SURGERY     TUBAL LIGATION      Medications:  Current Outpatient Medications on File Prior to Visit  Medication Sig   apixaban (ELIQUIS) 5 MG TABS tablet Take 5 mg by mouth 2 (two) times daily.   atorvastatin (LIPITOR) 10 MG tablet Take 10 mg by mouth daily.   baclofen  (LIORESAL ) 10 MG tablet TAKE 1 TABLET BY MOUTH EVERYDAY AT BEDTIME   calcium-vitamin D (OSCAL WITH D) 500-200  MG-UNIT per tablet Take 1 tablet by mouth.   cyanocobalamin (VITAMIN B12) 1000 MCG tablet Take 1,000 mcg by mouth daily.   Multiple Vitamins-Minerals (PRESERVISION AREDS 2 PO) Take 1 tablet by mouth in the morning and at bedtime.   No current facility-administered medications on file prior to visit.    Allergies:  Allergies  Allergen Reactions   Sulfa Antibiotics Hives   Sulfasalazine Hives   Green Tea Leaf Ext Palpitations   Pen-Vee K [Penicillin V] Rash   Penicillin G Benzathine Rash    Social History:  Social History   Socioeconomic History   Marital status: Married    Spouse name: Not on file   Number of children: Not on file   Years of education: 12   Highest education level: High school graduate  Occupational History   Not on file  Tobacco Use   Smoking  status: Former   Smokeless tobacco: Never   Tobacco comments:    quit in 95- smoked for 3 years total   Vaping Use   Vaping status: Never Used  Substance and Sexual Activity   Alcohol use: No    Alcohol/week: 0.0 standard drinks of alcohol   Drug use: No   Sexual activity: Yes  Other Topics Concern   Not on file  Social History Narrative   Not on file   Social Drivers of Health   Financial Resource Strain: Low Risk  (09/07/2024)   Overall Financial Resource Strain (CARDIA)    Difficulty of Paying Living Expenses: Not hard at all  Food Insecurity: No Food Insecurity (09/07/2024)   Hunger Vital Sign    Worried About Running Out of Food in the Last Year: Never true    Ran Out of Food in the Last Year: Never true  Transportation Needs: No Transportation Needs (09/07/2024)   PRAPARE - Administrator, Civil Service (Medical): No    Lack of Transportation (Non-Medical): No  Physical Activity: Insufficiently Active (09/07/2024)   Exercise Vital Sign    Days of Exercise per Week: 7 days    Minutes of Exercise per Session: 20 min  Stress: No Stress Concern Present (09/07/2024)   [employer] of Occupational Health - Occupational Stress Questionnaire    Feeling of Stress: Not at all  Social Connections: Moderately Integrated (09/07/2024)   Social Connection and Isolation Panel    Frequency of Communication with Friends and Family: More than three times a week    Frequency of Social Gatherings with Friends and Family: More than three times a week    Attends Religious Services: More than 4 times per year    Active Member of [employer] or Organizations: No    Attends Banker Meetings: Never    Marital Status: Married  Catering manager Violence: Not At Risk (09/07/2024)   Humiliation, Afraid, Rape, and Kick questionnaire    Fear of Current or Ex-Partner: No    Emotionally Abused: No    Physically Abused: No    Sexually Abused: No   Social History   Tobacco Use   Smoking Status Former  Smokeless Tobacco Never  Tobacco Comments   quit in 95- smoked for 3 years total    Social History   Substance and Sexual Activity  Alcohol Use No   Alcohol/week: 0.0 standard drinks of alcohol    Family History:  Family History  Problem Relation Age of Onset   Cancer Mother        breast and bone  Hypertension Mother    Cancer Father        prostate   Hypertension Sister    Cancer Brother    Hypertension Daughter    Hypothyroidism Daughter    Allergies Daughter    Hyperlipidemia Maternal Grandmother    Heart attack Maternal Grandmother    Stroke Paternal Grandfather     Past medical history, surgical history, medications, allergies, family history and social history reviewed with patient today and changes made to appropriate areas of the chart.   Review of Systems  Eyes:  Negative for blurred vision and double vision.  Respiratory:  Negative for shortness of breath.   Cardiovascular:  Negative for chest pain, palpitations and leg swelling.  Neurological:  Negative for dizziness and headaches.  Psychiatric/Behavioral:  Positive for depression.    All other ROS negative except what is listed above and in the HPI.      Objective:    BP 131/73   Pulse 76   Temp 98.1 F (36.7 C) (Oral)   Ht 5' 6.5 (1.689 m)   Wt 159 lb 9.6 oz (72.4 kg)   LMP 01/03/1999 (Approximate)   SpO2 98%   BMI 25.37 kg/m   Wt Readings from Last 3 Encounters:  09/07/24 159 lb 9.6 oz (72.4 kg)  03/02/24 163 lb 3.2 oz (74 kg)  09/02/23 161 lb 12.8 oz (73.4 kg)    Physical Exam Vitals and nursing note reviewed.  Constitutional:      General: She is awake. She is not in acute distress.    Appearance: Normal appearance. She is well-developed. She is not ill-appearing.  HENT:     Head: Normocephalic and atraumatic.     Right Ear: Hearing, tympanic membrane, ear canal and external ear normal. No drainage.     Left Ear: Hearing, tympanic membrane, ear canal and  external ear normal. No drainage.     Nose: Nose normal.     Right Sinus: No maxillary sinus tenderness or frontal sinus tenderness.     Left Sinus: No maxillary sinus tenderness or frontal sinus tenderness.     Mouth/Throat:     Mouth: Mucous membranes are moist.     Pharynx: Oropharynx is clear. Uvula midline. No pharyngeal swelling, oropharyngeal exudate or posterior oropharyngeal erythema.  Eyes:     General: Lids are normal.        Right eye: No discharge.        Left eye: No discharge.     Extraocular Movements: Extraocular movements intact.     Conjunctiva/sclera: Conjunctivae normal.     Pupils: Pupils are equal, round, and reactive to light.     Visual Fields: Right eye visual fields normal and left eye visual fields normal.  Neck:     Thyroid : No thyromegaly.     Vascular: No carotid bruit.     Trachea: Trachea normal.  Cardiovascular:     Rate and Rhythm: Normal rate and regular rhythm.     Heart sounds: Normal heart sounds. No murmur heard.    No gallop.  Pulmonary:     Effort: Pulmonary effort is normal. No accessory muscle usage or respiratory distress.     Breath sounds: Normal breath sounds.  Chest:  Breasts:    Right: Normal.     Left: Normal.  Abdominal:     General: Bowel sounds are normal.     Palpations: Abdomen is soft. There is no hepatomegaly or splenomegaly.     Tenderness: There is no abdominal tenderness.  Musculoskeletal:        General: Normal range of motion.     Cervical back: Normal range of motion and neck supple.     Right lower leg: No edema.     Left lower leg: No edema.  Lymphadenopathy:     Head:     Right side of head: No submental, submandibular, tonsillar, preauricular or posterior auricular adenopathy.     Left side of head: No submental, submandibular, tonsillar, preauricular or posterior auricular adenopathy.     Cervical: No cervical adenopathy.     Upper Body:     Right upper body: No supraclavicular, axillary or pectoral  adenopathy.     Left upper body: No supraclavicular, axillary or pectoral adenopathy.  Skin:    General: Skin is warm and dry.     Capillary Refill: Capillary refill takes less than 2 seconds.     Findings: No rash.  Neurological:     Mental Status: She is alert and oriented to person, place, and time.     Gait: Gait is intact.  Psychiatric:        Attention and Perception: Attention normal.        Mood and Affect: Mood normal.        Speech: Speech normal.        Behavior: Behavior normal. Behavior is cooperative.        Thought Content: Thought content normal.        Judgment: Judgment normal.     Results for orders placed or performed in visit on 09/07/24  Microscopic Examination   Collection Time: 09/07/24 10:56 AM   Urine  Result Value Ref Range   WBC, UA None seen 0 - 5 /hpf   RBC, Urine 0-2 0 - 2 /hpf   Epithelial Cells (non renal) 0-10 0 - 10 /hpf   Bacteria, UA None seen None seen/Few  Urinalysis, Routine w reflex microscopic   Collection Time: 09/07/24 10:56 AM  Result Value Ref Range   Specific Gravity, UA 1.015 1.005 - 1.030   pH, UA 7.0 5.0 - 7.5   Color, UA Yellow Yellow   Appearance Ur Clear Clear   Leukocytes,UA Negative Negative   Protein,UA Negative Negative/Trace   Glucose, UA Negative Negative   Ketones, UA Negative Negative   RBC, UA 1+ (A) Negative   Bilirubin, UA Negative Negative   Urobilinogen, Ur 0.2 0.2 - 1.0 mg/dL   Nitrite, UA Negative Negative   Microscopic Examination See below:       Assessment & Plan:   Problem List Items Addressed This Visit       Cardiovascular and Mediastinum   Hypertension   Chronic.  Controlled.  Continue with current medication regimen on Losartan , HCTZ and Metoprolol .  Continue to check blood pressures at home.  Refills sent today.  Labs ordered today.  Return to clinic in 6 months for reevaluation.  Call sooner if concerns arise.       Relevant Medications   losartan -hydrochlorothiazide (HYZAAR) 50-12.5  MG tablet   metoprolol  succinate (TOPROL -XL) 25 MG 24 hr tablet   Other Relevant Orders   Comprehensive metabolic panel with GFR   Atrial fibrillation (HCC)   Chronic.  Controlled.  Continue with current medication regimen of Eliquis and Metoprolol . Continue to follow up with Cardiology.  Labs ordered today.  Return to clinic in 6 months for reevaluation.  Call sooner if concerns arise.       Relevant Medications   losartan -hydrochlorothiazide (HYZAAR) 50-12.5 MG  tablet   metoprolol  succinate (TOPROL -XL) 25 MG 24 hr tablet     Hematopoietic and Hemostatic   Other thrombophilia (HCC)   Chronic.  Controlled.  Continue with current medication regimen.  Labs ordered today.  Return to clinic in 6 months for reevaluation.  Call sooner if concerns arise.        Relevant Orders   CBC with Differential/Platelet     Other   Hyperlipidemia, mixed   Chronic.  Controlled.  Continue with current medication regimen on Atorvastatin daily.  Refills sent today.  Labs ordered today.  Return to clinic in 6 months for reevaluation.  Call sooner if concerns arise.       Relevant Medications   losartan -hydrochlorothiazide (HYZAAR) 50-12.5 MG tablet   metoprolol  succinate (TOPROL -XL) 25 MG 24 hr tablet   Other Relevant Orders   Lipid panel   Macular degeneration, wet (HCC)   Receiving injections every 6-8 weeks.  Continues to follow up with specialist.      Depression, major, single episode, complete remission (HCC)   Chronic.  Controlled.  Continue with current medication regimen of Celexa  daily.  Refills sent today.  Labs ordered today.  Return to clinic in 6 months for reevaluation.  Call sooner if concerns arise.       Relevant Medications   citalopram  (CELEXA ) 40 MG tablet   Other Visit Diagnoses       Encounter for Medicare annual wellness exam    -  Primary     Annual physical exam       Health maintenance reviewed during visit today.  Labs ordered.  Vaccines reviewed.  No longer needs a  mammogram and colonoscopy.   Relevant Orders   TSH     Need for influenza vaccination       Relevant Orders   Flu vaccine HIGH DOSE PF(Fluzone Trivalent) (Completed)     Abnormal urinalysis       Relevant Orders   Urinalysis, Routine w reflex microscopic (Completed)        Follow up plan: Return in about 6 months (around 03/07/2025) for HTN, HLD, DM2 FU.   LABORATORY TESTING:  - Pap smear: NA  IMMUNIZATIONS:   - Tdap: Tetanus vaccination status reviewed: last tetanus booster within 10 years. - Influenza: Administered today - Pneumovax: Up to date - Prevnar: Up to date - COVID: Not applicable - HPV: Not applicable - Shingrix vaccine: Refused  SCREENING: -Mammogram: Not applicable  - Colonoscopy: Not applicable  - Bone Density: Not applicable  -Hearing Test: Not applicable  -Spirometry: Not applicable   PATIENT COUNSELING:   Advised to take 1 mg of folate supplement per day if capable of pregnancy.   Sexuality: Discussed sexually transmitted diseases, partner selection, use of condoms, avoidance of unintended pregnancy  and contraceptive alternatives.   Advised to avoid cigarette smoking.  I discussed with the patient that most people either abstain from alcohol or drink within safe limits (<=14/week and <=4 drinks/occasion for males, <=7/weeks and <= 3 drinks/occasion for females) and that the risk for alcohol disorders and other health effects rises proportionally with the number of drinks per week and how often a drinker exceeds daily limits.  Discussed cessation/primary prevention of drug use and availability of treatment for abuse.   Diet: Encouraged to adjust caloric intake to maintain  or achieve ideal body weight, to reduce intake of dietary saturated fat and total fat, to limit sodium intake by avoiding high sodium foods and not adding table salt, and  to maintain adequate dietary potassium and calcium preferably from fresh fruits, vegetables, and low-fat dairy  products.    stressed the importance of regular exercise  Injury prevention: Discussed safety belts, safety helmets, smoke detector, smoking near bedding or upholstery.   Dental health: Discussed importance of regular tooth brushing, flossing, and dental visits.    NEXT PREVENTATIVE PHYSICAL DUE IN 1 YEAR. Return in about 6 months (around 03/07/2025) for HTN, HLD, DM2 FU.

## 2024-09-07 NOTE — Assessment & Plan Note (Signed)
 Chronic.  Controlled.  Continue with current medication regimen.  Labs ordered today.  Return to clinic in 6 months for reevaluation.  Call sooner if concerns arise.  ? ?

## 2024-09-07 NOTE — Assessment & Plan Note (Signed)
Chronic.  Controlled.  Continue with current medication regimen of Celexa daily.  Refills sent today.  Labs ordered today.  Return to clinic in 6 months for reevaluation.  Call sooner if concerns arise.

## 2024-09-07 NOTE — Assessment & Plan Note (Signed)
 Chronic.  Controlled.  Continue with current medication regimen on Losartan , HCTZ and Metoprolol .  Continue to check blood pressures at home.  Refills sent today.  Labs ordered today.  Return to clinic in 6 months for reevaluation.  Call sooner if concerns arise.

## 2024-09-08 ENCOUNTER — Ambulatory Visit: Payer: Self-pay | Admitting: Nurse Practitioner

## 2024-09-08 ENCOUNTER — Ambulatory Visit

## 2024-09-08 LAB — COMPREHENSIVE METABOLIC PANEL WITH GFR
ALT: 22 IU/L (ref 0–32)
AST: 17 IU/L (ref 0–40)
Albumin: 4.5 g/dL (ref 3.8–4.8)
Alkaline Phosphatase: 61 IU/L (ref 49–135)
BUN/Creatinine Ratio: 18 (ref 12–28)
BUN: 13 mg/dL (ref 8–27)
Bilirubin Total: 0.9 mg/dL (ref 0.0–1.2)
CO2: 23 mmol/L (ref 20–29)
Calcium: 9.5 mg/dL (ref 8.7–10.3)
Chloride: 103 mmol/L (ref 96–106)
Creatinine, Ser: 0.73 mg/dL (ref 0.57–1.00)
Globulin, Total: 2.2 g/dL (ref 1.5–4.5)
Glucose: 85 mg/dL (ref 70–99)
Potassium: 4.2 mmol/L (ref 3.5–5.2)
Sodium: 140 mmol/L (ref 134–144)
Total Protein: 6.7 g/dL (ref 6.0–8.5)
eGFR: 85 mL/min/1.73 (ref >59)

## 2024-09-08 LAB — CBC WITH DIFFERENTIAL/PLATELET
Basophils Absolute: 0 x10E3/uL (ref 0.0–0.2)
Basos: 0 %
EOS (ABSOLUTE): 0.1 x10E3/uL (ref 0.0–0.4)
Eos: 2 %
Hematocrit: 42.4 % (ref 34.0–46.6)
Hemoglobin: 13.8 g/dL (ref 11.1–15.9)
Immature Grans (Abs): 0 x10E3/uL (ref 0.0–0.1)
Immature Granulocytes: 0 %
Lymphocytes Absolute: 1.6 x10E3/uL (ref 0.7–3.1)
Lymphs: 30 %
MCH: 30.1 pg (ref 26.6–33.0)
MCHC: 32.5 g/dL (ref 31.5–35.7)
MCV: 92 fL (ref 79–97)
Monocytes Absolute: 0.4 x10E3/uL (ref 0.1–0.9)
Monocytes: 7 %
Neutrophils Absolute: 3.3 x10E3/uL (ref 1.4–7.0)
Neutrophils: 61 %
Platelets: 254 x10E3/uL (ref 150–450)
RBC: 4.59 x10E6/uL (ref 3.77–5.28)
RDW: 12.6 % (ref 11.7–15.4)
WBC: 5.5 x10E3/uL (ref 3.4–10.8)

## 2024-09-08 LAB — LIPID PANEL
Chol/HDL Ratio: 2.2 ratio (ref 0.0–4.4)
Cholesterol, Total: 124 mg/dL (ref 100–199)
HDL: 56 mg/dL (ref >39)
LDL Chol Calc (NIH): 52 mg/dL (ref 0–99)
Triglycerides: 82 mg/dL (ref 0–149)
VLDL Cholesterol Cal: 16 mg/dL (ref 5–40)

## 2024-09-08 LAB — TSH: TSH: 1.44 u[IU]/mL (ref 0.450–4.500)

## 2024-11-04 ENCOUNTER — Encounter: Payer: Self-pay | Admitting: Nurse Practitioner

## 2024-11-04 LAB — HM MAMMOGRAPHY

## 2025-03-08 ENCOUNTER — Ambulatory Visit: Admitting: Nurse Practitioner
